# Patient Record
Sex: Female | Born: 1975 | Race: Black or African American | Hispanic: No | Marital: Married | Smoking: Never smoker
Health system: Southern US, Community
[De-identification: ages and names within clinical notes are randomized; demographics above are authoritative.]

## PROBLEM LIST (undated history)

## (undated) DIAGNOSIS — T7840XA Allergy, unspecified, initial encounter: Secondary | ICD-10-CM

## (undated) DIAGNOSIS — E559 Vitamin D deficiency, unspecified: Secondary | ICD-10-CM

## (undated) DIAGNOSIS — R7309 Other abnormal glucose: Secondary | ICD-10-CM

## (undated) HISTORY — DX: Other abnormal glucose: R73.09

## (undated) HISTORY — DX: Vitamin D deficiency, unspecified: E55.9

## (undated) HISTORY — DX: Allergy, unspecified, initial encounter: T78.40XA

---

## 1998-08-19 ENCOUNTER — Ambulatory Visit (HOSPITAL_COMMUNITY): Admission: RE | Admit: 1998-08-19 | Discharge: 1998-08-19 | Payer: Self-pay | Admitting: Infectious Diseases

## 1998-08-19 ENCOUNTER — Encounter: Payer: Self-pay | Admitting: Infectious Diseases

## 1999-11-08 ENCOUNTER — Other Ambulatory Visit: Admission: RE | Admit: 1999-11-08 | Discharge: 1999-11-08 | Payer: Self-pay | Admitting: Family Medicine

## 2000-11-26 ENCOUNTER — Other Ambulatory Visit: Admission: RE | Admit: 2000-11-26 | Discharge: 2000-11-26 | Payer: Self-pay | Admitting: Family Medicine

## 2000-12-03 ENCOUNTER — Encounter: Admission: RE | Admit: 2000-12-03 | Discharge: 2000-12-03 | Payer: Self-pay | Admitting: Family Medicine

## 2000-12-03 ENCOUNTER — Encounter: Payer: Self-pay | Admitting: Family Medicine

## 2002-02-13 ENCOUNTER — Other Ambulatory Visit: Admission: RE | Admit: 2002-02-13 | Discharge: 2002-02-13 | Payer: Self-pay | Admitting: Family Medicine

## 2004-03-15 ENCOUNTER — Other Ambulatory Visit: Admission: RE | Admit: 2004-03-15 | Discharge: 2004-03-15 | Payer: Self-pay | Admitting: Family Medicine

## 2005-04-03 ENCOUNTER — Emergency Department (HOSPITAL_COMMUNITY): Admission: EM | Admit: 2005-04-03 | Discharge: 2005-04-03 | Payer: Self-pay | Admitting: Family Medicine

## 2005-10-15 ENCOUNTER — Other Ambulatory Visit: Admission: RE | Admit: 2005-10-15 | Discharge: 2005-10-15 | Payer: Self-pay | Admitting: Family Medicine

## 2007-07-10 ENCOUNTER — Other Ambulatory Visit: Admission: RE | Admit: 2007-07-10 | Discharge: 2007-07-10 | Payer: Self-pay | Admitting: Family Medicine

## 2007-11-26 ENCOUNTER — Ambulatory Visit (HOSPITAL_COMMUNITY): Admission: RE | Admit: 2007-11-26 | Discharge: 2007-11-26 | Payer: Self-pay | Admitting: *Deleted

## 2007-12-11 ENCOUNTER — Encounter: Admission: RE | Admit: 2007-12-11 | Discharge: 2007-12-11 | Payer: Self-pay | Admitting: *Deleted

## 2008-04-08 ENCOUNTER — Ambulatory Visit (HOSPITAL_COMMUNITY): Admission: RE | Admit: 2008-04-08 | Discharge: 2008-04-08 | Payer: Self-pay | Admitting: *Deleted

## 2008-06-03 ENCOUNTER — Encounter: Admission: RE | Admit: 2008-06-03 | Discharge: 2008-09-01 | Payer: Self-pay | Admitting: Surgery

## 2008-06-28 ENCOUNTER — Ambulatory Visit (HOSPITAL_COMMUNITY): Admission: RE | Admit: 2008-06-28 | Discharge: 2008-06-29 | Payer: Self-pay | Admitting: *Deleted

## 2008-06-28 HISTORY — PX: LAPAROSCOPIC GASTRIC BANDING: SHX1100

## 2008-08-03 ENCOUNTER — Other Ambulatory Visit: Admission: RE | Admit: 2008-08-03 | Discharge: 2008-08-03 | Payer: Self-pay | Admitting: Family Medicine

## 2008-08-10 ENCOUNTER — Encounter: Admission: RE | Admit: 2008-08-10 | Discharge: 2008-09-23 | Payer: Self-pay | Admitting: Surgery

## 2008-09-21 ENCOUNTER — Encounter: Admission: RE | Admit: 2008-09-21 | Discharge: 2008-09-21 | Payer: Self-pay | Admitting: Surgery

## 2008-12-22 ENCOUNTER — Encounter: Admission: RE | Admit: 2008-12-22 | Discharge: 2009-01-19 | Payer: Self-pay | Admitting: Surgery

## 2009-01-22 HISTORY — PX: EYE SURGERY: SHX253

## 2009-08-30 ENCOUNTER — Other Ambulatory Visit: Admission: RE | Admit: 2009-08-30 | Discharge: 2009-08-30 | Payer: Self-pay | Admitting: Family Medicine

## 2010-02-15 IMAGING — CR DG ABDOMEN 1V
1 series · 1 of 1 positions shown · non-contrast
Comparison: None

CLINICAL DATA: Follow-up gastric banding

ABDOMEN - 1 VIEW

[t abdomen supine *]
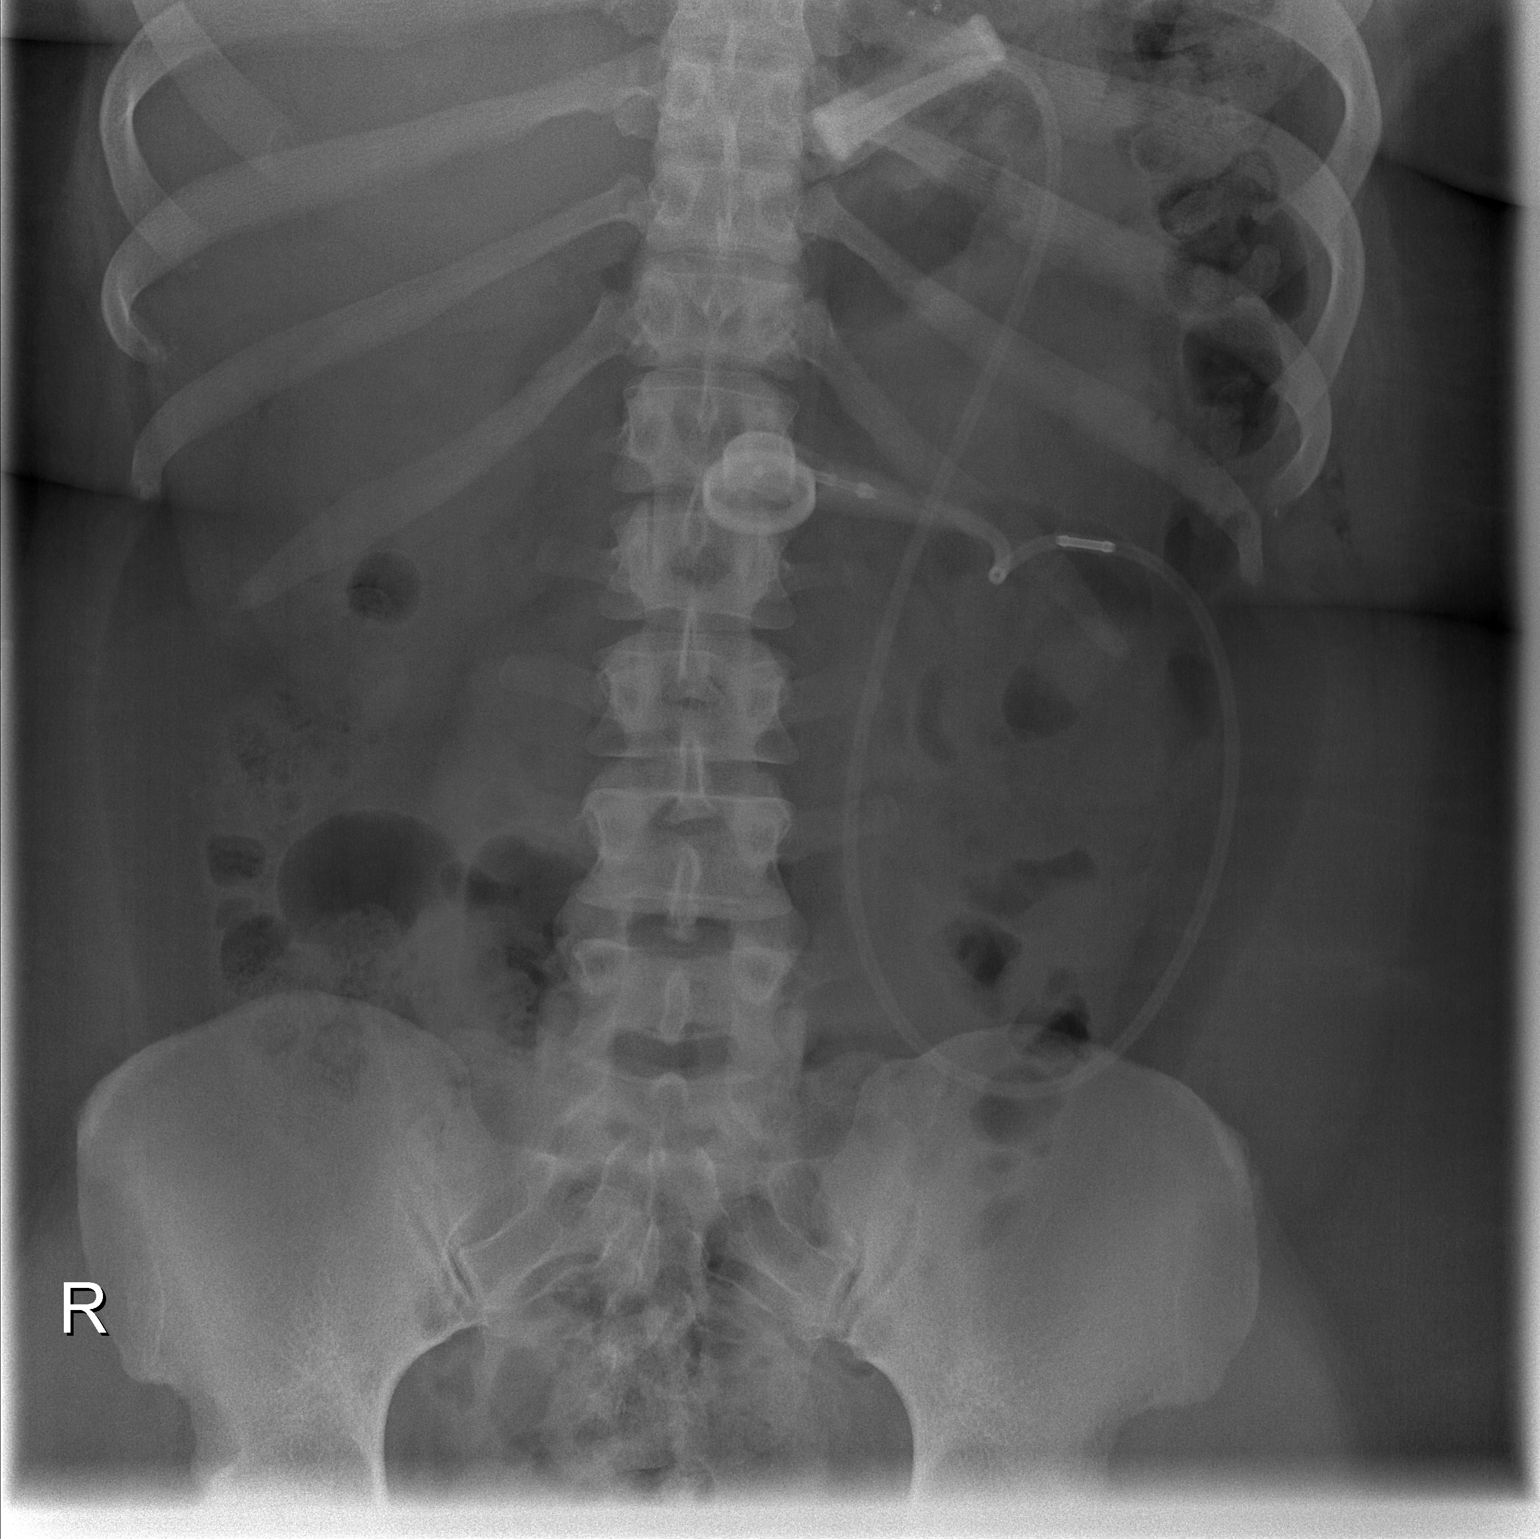

[1 of 1 positions shown; findings below may reference images not displayed]

FINDINGS: The gastric band is normal.  Normal bowel gas.  Osseous structures
normal.
IMPRESSION: Normal postop film.

## 2010-05-01 LAB — CBC
HCT: 39 % (ref 36.0–46.0)
HCT: 40 % (ref 36.0–46.0)
Hemoglobin: 13.2 g/dL (ref 12.0–15.0)
Hemoglobin: 13.3 g/dL (ref 12.0–15.0)
MCHC: 33.1 g/dL (ref 30.0–36.0)
MCHC: 33.9 g/dL (ref 30.0–36.0)
MCV: 90.4 fL (ref 78.0–100.0)
MCV: 91.1 fL (ref 78.0–100.0)
Platelets: 332 10*3/uL (ref 150–400)
Platelets: 371 10*3/uL (ref 150–400)
RBC: 4.28 MIL/uL (ref 3.87–5.11)
RBC: 4.43 MIL/uL (ref 3.87–5.11)
RDW: 12.6 % (ref 11.5–15.5)
RDW: 12.9 % (ref 11.5–15.5)
WBC: 4.3 10*3/uL (ref 4.0–10.5)
WBC: 8.9 10*3/uL (ref 4.0–10.5)

## 2010-05-01 LAB — DIFFERENTIAL
Basophils Absolute: 0 10*3/uL (ref 0.0–0.1)
Basophils Absolute: 0 10*3/uL (ref 0.0–0.1)
Basophils Relative: 0 % (ref 0–1)
Basophils Relative: 1 % (ref 0–1)
Eosinophils Absolute: 0 10*3/uL (ref 0.0–0.7)
Eosinophils Absolute: 0.1 10*3/uL (ref 0.0–0.7)
Eosinophils Relative: 0 % (ref 0–5)
Eosinophils Relative: 2 % (ref 0–5)
Lymphocytes Relative: 20 % (ref 12–46)
Lymphocytes Relative: 56 % — ABNORMAL HIGH (ref 12–46)
Lymphs Abs: 1.8 10*3/uL (ref 0.7–4.0)
Lymphs Abs: 2.4 10*3/uL (ref 0.7–4.0)
Monocytes Absolute: 0.2 10*3/uL (ref 0.1–1.0)
Monocytes Absolute: 0.5 10*3/uL (ref 0.1–1.0)
Monocytes Relative: 5 % (ref 3–12)
Monocytes Relative: 6 % (ref 3–12)
Neutro Abs: 1.6 10*3/uL — ABNORMAL LOW (ref 1.7–7.7)
Neutro Abs: 6.6 10*3/uL (ref 1.7–7.7)
Neutrophils Relative %: 37 % — ABNORMAL LOW (ref 43–77)
Neutrophils Relative %: 74 % (ref 43–77)

## 2010-05-01 LAB — COMPREHENSIVE METABOLIC PANEL
ALT: 21 U/L (ref 0–35)
AST: 23 U/L (ref 0–37)
Albumin: 4.2 g/dL (ref 3.5–5.2)
Alkaline Phosphatase: 55 U/L (ref 39–117)
BUN: 10 mg/dL (ref 6–23)
CO2: 30 mEq/L (ref 19–32)
Calcium: 9.5 mg/dL (ref 8.4–10.5)
Chloride: 107 mEq/L (ref 96–112)
Creatinine, Ser: 0.74 mg/dL (ref 0.4–1.2)
GFR calc Af Amer: >60 mL/min (ref >60)
GFR calc non Af Amer: >60 mL/min (ref >60)
Glucose, Bld: 92 mg/dL (ref 70–99)
Potassium: 5 mEq/L (ref 3.5–5.1)
Sodium: 143 mEq/L (ref 135–145)
Total Bilirubin: 0.6 mg/dL (ref 0.3–1.2)
Total Protein: 7.2 g/dL (ref 6.0–8.3)

## 2010-05-01 LAB — PREGNANCY, URINE: Preg Test, Ur: NEGATIVE

## 2010-06-06 NOTE — Op Note (Signed)
NAMEDANNILYNN, GALLINA                ACCOUNT NO.:  [id number]   MEDICAL RECORD NO.:  [id number]          PATIENT TYPE:  OIB   LOCATION:  1536                         FACILITY:  [facility]   PHYSICIAN:  Thornton Park. Daphine Deutscher, MD  DATE OF BIRTH:  30-Sep-1975   DATE OF PROCEDURE:  06/28/2008  DATE OF DISCHARGE:                               OPERATIVE REPORT   PREOPERATIVE DIAGNOSIS:  Morbid obesity, body mass index of 48.   POSTOPERATIVE DIAGNOSIS:  Morbid obesity, body mass index of 48.   PROCEDURE:  Laparoscopic gastric banding (Allergan APL system).   SURGEON:  Thornton Park. Daphine Deutscher, MD.   ASSISTANTMarland Kitchen  Sandria Bales. Ezzard Standing, M.D.   DESCRIPTION OF PROCEDURE:  Ms. Nevares is a 35 year old African American  lady who works on [employer] at [employer] and has been through our program for  weight loss.  She decided to proceed along the lap band pathway.   She was taken back to room one at [facility] and given general  anesthesia.  The abdomen was prepped with Techni-Care equivalent and  draped sterilely.  Access to the abdomen was achieved through the left  upper quadrant using an OptiView technique and a zero degree camera.  This was a 10-11 mm port.  The abdomen was insufflated and standard port  placements were used including a 15 in the right upper quadrant, 11  below that, another 11 left of the umbilicus for the camera, and a 5 in  the upper midline to retract the liver.   Interesting findings were that she had two prominent vessels going into  the left lateral segment which could be accessory hepatic arteries which  were certainly preserved.  She had a negative upper GI series.  There  was no evidence of a hiatal hernia.  I first dissected over on the left  side by the left crus for an exit site for the band passer.  We then  went down on the right crus and found a site to introduce the band  passer.  The band passer was introduced and went around and came out  without difficulty.  Initially, our  detail on our the APL band had sort  of torn, so I went ahead and replaced that, along with a little silk on  it to help hold it in place in the band passer.  This was threaded  through, came around, engaged in the buckle which was then snapped.  This was an APL band and it had seemed to be the right fit.  It was then  plicated with three sutures, taking the stomach and plicated it to  stomach creating a nice anterior plication.  These were held in place  with tie knots.  The band tubing was then brought out  through the lower port on the right and connected to a port which had  Marlex mesh on the back which was then placed in the subcutaneous  pocket.  It was closed with 4-0 Vicryl interrupted, Benzoin and Steri-  Strips.  The patient tolerated the procedure well and was taken to the  recovery  room in satisfactory condition.      Thornton Park Daphine Deutscher, MD  Electronically Signed     MBM/MEDQ  D:  06/28/2008  T:  06/28/2008  Job:  [id number]   cc:   Gretta Arab. Valentina Lucks, M.D.  Fax: [phone]   Maxie Better, M.D.  Fax: [phone]

## 2010-07-21 ENCOUNTER — Encounter (INDEPENDENT_AMBULATORY_CARE_PROVIDER_SITE_OTHER): Payer: Self-pay | Admitting: Physician Assistant

## 2010-07-21 ENCOUNTER — Ambulatory Visit (INDEPENDENT_AMBULATORY_CARE_PROVIDER_SITE_OTHER): Payer: Commercial Managed Care - PPO | Admitting: Physician Assistant

## 2010-07-21 DIAGNOSIS — Z4651 Encounter for fitting and adjustment of gastric lap band: Secondary | ICD-10-CM

## 2010-07-21 NOTE — Progress Notes (Signed)
Subjective:     Patient ID: Jessica Yoder, female   DOB: [birth year], 35 y.o.   MRN: [id number]    BP 122/76  Ht 5' 6.5" (1.689 m)  Wt 269 lb (122.018 kg)  BMI 42.77 kg/m2    HPI See scanned dictated note  Review of Systems     Objective:   Physical Exam     Assessment:         Plan:

## 2010-07-21 NOTE — Patient Instructions (Signed)
Take clear liquids for the next 48 hours. Thin protein shakes are ok to start on Saturday evening. Call us if you have persistent vomiting or regurgitation, night cough or reflux symptoms. Return as scheduled or sooner if you notice no changes in hunger/portion sizes.   

## 2010-10-06 ENCOUNTER — Encounter (INDEPENDENT_AMBULATORY_CARE_PROVIDER_SITE_OTHER): Payer: Self-pay | Admitting: Surgery

## 2011-05-25 ENCOUNTER — Emergency Department (HOSPITAL_COMMUNITY)
Admission: EM | Admit: 2011-05-25 | Discharge: 2011-05-25 | Disposition: A | Discharge status: Home / Self Care | Payer: 59 | Source: Home / Self Care | Attending: Family Medicine | Admitting: Family Medicine

## 2011-05-25 ENCOUNTER — Encounter (HOSPITAL_COMMUNITY): Payer: Self-pay | Admitting: *Deleted

## 2011-05-25 DIAGNOSIS — J209 Acute bronchitis, unspecified: Secondary | ICD-10-CM

## 2011-05-25 DIAGNOSIS — J302 Other seasonal allergic rhinitis: Secondary | ICD-10-CM

## 2011-05-25 DIAGNOSIS — J309 Allergic rhinitis, unspecified: Secondary | ICD-10-CM

## 2011-05-25 MED ORDER — AZITHROMYCIN 250 MG PO TABS: ORAL_TABLET | ORAL | Status: AC

## 2011-05-25 MED ORDER — HYDROCOD POLST-CHLORPHEN POLST 10-8 MG/5ML PO LQCR: 5.0000 mL | Freq: Two times a day (BID) | ORAL | Status: DC

## 2011-05-25 MED ORDER — CETIRIZINE HCL 10 MG PO TABS: 10.0000 mg | ORAL_TABLET | Freq: Every day | ORAL | Status: DC

## 2011-05-25 NOTE — ED Provider Notes (Signed)
History     CSN: [id number]  Arrival date & time 05/25/11  0814   First MD Initiated Contact with Patient 05/25/11 0831      Chief Complaint  Patient presents with  . Cough  . Sore Throat  . Fever    (Consider location/radiation/quality/duration/timing/severity/associated sxs/prior treatment) Patient is a 36 y.o. female presenting with cough, pharyngitis, and fever. The history is provided by the patient.  Cough This is a new problem. The current episode started more than 2 days ago. The problem has been gradually worsening. The cough is productive of sputum. Associated symptoms include sore throat. Pertinent negatives include no shortness of breath and no wheezing. She is not a smoker.  Sore Throat Pertinent negatives include no shortness of breath.  Fever Primary symptoms of the febrile illness include fever and cough. Primary symptoms do not include wheezing or shortness of breath.    Past Medical History  Diagnosis Date  . Allergy   . Migraine     Past Surgical History  Procedure Date  . Laparoscopic gastric banding 2010  . Eye surgery 2011    Lasik    Family History  Problem Relation Age of Onset  . Other Father     ETOH Abuse    History  Substance Use Topics  . Smoking status: Never Smoker   . Smokeless tobacco: Not on file  . Alcohol Use: Yes     socially    OB History    Grav Para Term Preterm Abortions TAB SAB Ect Mult Living                  Review of Systems  Constitutional: Positive for fever.  HENT: Positive for congestion and sore throat.   Respiratory: Positive for cough. Negative for shortness of breath and wheezing.   Gastrointestinal: Negative.     Allergies  Amitriptyline and Latex  Home Medications   Current Outpatient Rx  Name Route Sig Dispense Refill  . GUAIFENESIN ER 600 MG PO TB12 Oral Take 1,200 mg by mouth as needed.    . IBUPROFEN 200 MG PO TABS Oral Take 200 mg by mouth every 6 (six) hours as needed.    .  AZITHROMYCIN 250 MG PO TABS  Take as directed on pack 6 each 0  . CALCIUM-VITAMIN D 500-200 MG-UNIT PO TABS Oral Take 1 tablet by mouth 2 (two) times daily with a meal. Patient takes 600 mg     . CETIRIZINE HCL 10 MG PO TABS Oral Take 1 tablet (10 mg total) by mouth daily. One tab daily for allergies 30 tablet 1  . HYDROCOD POLST-CPM POLST ER 10-8 MG/5ML PO LQCR Oral Take 5 mLs by mouth every 12 (twelve) hours. 115 mL 0  . ONE-DAILY MULTI VITAMINS PO TABS Oral Take 1 tablet by mouth daily.        BP 105/67  Pulse 88  Temp(Src) 98.9 F (37.2 C) (Oral)  Resp 16  SpO2 99%  LMP 05/25/2011  Physical Exam  Nursing note and vitals reviewed. Constitutional: She is oriented to person, place, and time. She appears well-developed and well-nourished.  HENT:  Head: Normocephalic.  Right Ear: External ear normal.  Left Ear: External ear normal.  Mouth/Throat: Oropharynx is clear and moist.  Eyes: Conjunctivae are normal. Pupils are equal, round, and reactive to light.  Neck: Normal range of motion. Neck supple.  Cardiovascular: Normal rate, regular rhythm, normal heart sounds and intact distal pulses.   Pulmonary/Chest: Effort normal and breath  sounds normal.  Lymphadenopathy:    She has no cervical adenopathy.  Neurological: She is alert and oriented to person, place, and time.  Skin: Skin is warm and dry.    ED Course  Procedures (including critical care time)  Labs Reviewed - No data to display No results found.   1. Bronchitis, acute   2. Seasonal allergies       MDM          Linna Hoff, MD 05/25/11 [id number]

## 2011-05-25 NOTE — Discharge Instructions (Signed)
Take all of medicine, drink lots of fluids,   return or see your doctor if further problems  °

## 2011-05-25 NOTE — ED Notes (Signed)
Pt is here with complaints of cough with green sputum, sore throat and fever since Tuesday.  Pt has lap band and is requesting liquid meds if a Rx is written.

## 2011-08-16 ENCOUNTER — Telehealth (INDEPENDENT_AMBULATORY_CARE_PROVIDER_SITE_OTHER): Payer: Self-pay | Admitting: General Surgery

## 2011-08-16 NOTE — Telephone Encounter (Signed)
Pt calling for possible lap band adjustment.  She is experiencing reflux (burning) and hiccups.  Tried to slow her eating, but still has reflux.  Denies vomiting.  No room on [facility] for another 4 weeks, so will try to work in with her surgeon, Dr. Daphine Deutscher.  Pt works at [employer] and can break free to walk over at any time.  Please advise; can leave VM on her cell phone.

## 2011-08-16 NOTE — Telephone Encounter (Signed)
Scheduled patient to see Dr Daphine Deutscher on 08/22/11 @1 :30 per Dr Daphine Deutscher

## 2011-08-22 ENCOUNTER — Encounter (INDEPENDENT_AMBULATORY_CARE_PROVIDER_SITE_OTHER): Payer: Self-pay | Admitting: Surgery

## 2011-08-22 ENCOUNTER — Ambulatory Visit (INDEPENDENT_AMBULATORY_CARE_PROVIDER_SITE_OTHER): Payer: Commercial Managed Care - PPO | Admitting: Surgery

## 2011-08-22 VITALS — BP 128/76 | HR 68 | Temp 98.1°F | Resp 16 | Ht 66.5 in | Wt 273.2 lb

## 2011-08-22 DIAGNOSIS — Z9884 Bariatric surgery status: Secondary | ICD-10-CM

## 2011-08-22 NOTE — Progress Notes (Signed)
Jessica Yoder Body mass index is 43.44 kg/(m^2).  Having regurgitation:  Yes-5 times recently  Nocturnal reflux?  some  Amount of fill   - 5 cc  She has done well until last 2 weeks and has developed vomiting.  May be anterior slip. Band vacation prescribed for 2 weeks then would gradually refill.

## 2011-08-22 NOTE — Patient Instructions (Signed)
I removed 5 cc from your Lapband APL.  This is a band vacation  1. Stay on liquids for the next 2 days as you adapt to your new fill volume.  Then resume your previous diet. 2. Decreasing your carbohydrate intake will hasten you weight loss.  Rely more on proteins for your meals.  Avoid condiments that contain sweets such as Honey Mustard and sugary salad dressings.   3. Stay in the "green zone".  If you are regurgitating with meals, having night time reflux, and find yourself eating soft comfort foods (mashed potatoes, potato chips)...realize that you are developing "maladaptive eating".  You will not lose weight this way and may regain weight.  The GREEN ZONE is eating smaller portions and not regurgitating.  Hence we may need to withdraw fluid from your band. 4. Build exercise into your daily routine.  Walking is the best way to start but do something every day if you can.

## 2011-09-06 ENCOUNTER — Ambulatory Visit (INDEPENDENT_AMBULATORY_CARE_PROVIDER_SITE_OTHER): Payer: Commercial Managed Care - PPO | Admitting: Physician Assistant

## 2011-09-06 ENCOUNTER — Encounter (INDEPENDENT_AMBULATORY_CARE_PROVIDER_SITE_OTHER): Payer: Commercial Managed Care - PPO

## 2011-09-06 ENCOUNTER — Encounter (INDEPENDENT_AMBULATORY_CARE_PROVIDER_SITE_OTHER): Payer: Self-pay

## 2011-09-06 VITALS — BP 120/70 | HR 66 | Resp 12 | Ht 66.5 in | Wt 274.0 lb

## 2011-09-06 DIAGNOSIS — Z4651 Encounter for fitting and adjustment of gastric lap band: Secondary | ICD-10-CM

## 2011-09-06 NOTE — Progress Notes (Signed)
  HISTORY: Jessica Yoder is a 36 y.o.female who received an AP-Large lap-band in June 2010 by Dr. Daphine Deutscher. She comes in having had 5 mL fluid removed last month for obstructive symptoms. She no longer has difficulty swallowing and would like a fill. She's hungry and eating more.  VITAL SIGNS: Filed Vitals:   09/06/11 1155  BP: 120/70  Pulse: 66  Resp: 12    PHYSICAL EXAM: Physical exam reveals a very well-appearing 36 y.o.female in no apparent distress Neurologic: Awake, alert, oriented Psych: Bright affect, conversant Respiratory: Breathing even and unlabored. No stridor or wheezing Abdomen: Soft, nontender, nondistended to palpation. Incisions well-healed. No incisional hernias. Port easily palpated. Extremities: Atraumatic, good range of motion.  ASSESMENT: 36 y.o.  female  s/p AP-Large lap-band.   PLAN: The patient's port was accessed with a 20G Huber needle without difficulty. Clear fluid was aspirated and 3 mL saline was added to the port. The patient was able to swallow water without difficulty following the procedure and was instructed to take clear liquids for the next 24-48 hours and advance slowly as tolerated.

## 2011-09-06 NOTE — Patient Instructions (Signed)
Take clear liquids tonight. Thin protein shakes are ok to start tomorrow morning. Slowly advance your diet thereafter. Call us if you have persistent vomiting or regurgitation, night cough or reflux symptoms. Return as scheduled or sooner if you notice no changes in hunger/portion sizes.  

## 2011-09-20 ENCOUNTER — Encounter (INDEPENDENT_AMBULATORY_CARE_PROVIDER_SITE_OTHER): Payer: Commercial Managed Care - PPO

## 2011-09-27 ENCOUNTER — Ambulatory Visit (INDEPENDENT_AMBULATORY_CARE_PROVIDER_SITE_OTHER): Payer: Commercial Managed Care - PPO | Admitting: Physician Assistant

## 2011-09-27 ENCOUNTER — Encounter (INDEPENDENT_AMBULATORY_CARE_PROVIDER_SITE_OTHER): Payer: Self-pay

## 2011-09-27 VITALS — BP 112/70 | Ht 66.5 in | Wt 278.0 lb

## 2011-09-27 DIAGNOSIS — Z4651 Encounter for fitting and adjustment of gastric lap band: Secondary | ICD-10-CM

## 2011-09-27 NOTE — Progress Notes (Signed)
  HISTORY: Jessica Yoder is a 36 y.o.female who received an AP-Large lap-band in June 2010 by Dr. Daphine Deutscher. She comes in with persistent hunger and larger than desired portions since her last visit a month ago. She had fluid removed in July secondary to GERD symptoms, which are no longer an issue.  VITAL SIGNS: Filed Vitals:   09/27/11 1137  BP: 112/70    PHYSICAL EXAM: Physical exam reveals a very well-appearing 36 y.o.female in no apparent distress Neurologic: Awake, alert, oriented Psych: Bright affect, conversant Respiratory: Breathing even and unlabored. No stridor or wheezing Abdomen: Soft, nontender, nondistended to palpation. Incisions well-healed. No incisional hernias. Port easily palpated. Extremities: Atraumatic, good range of motion.  ASSESMENT: 36 y.o.  female  s/p AP-Large lap-band.   PLAN: The patient's port was accessed with a 20G Huber needle without difficulty. Clear fluid was aspirated and 1 mL saline was added to the port. The patient was able to swallow water without difficulty following the procedure and was instructed to take clear liquids for the next 24-48 hours and advance slowly as tolerated.

## 2011-09-27 NOTE — Patient Instructions (Signed)
Take clear liquids tonight. Thin protein shakes are ok to start tomorrow morning. Slowly advance your diet thereafter. Call us if you have persistent vomiting or regurgitation, night cough or reflux symptoms. Return as scheduled or sooner if you notice no changes in hunger/portion sizes.  

## 2011-12-04 ENCOUNTER — Encounter (HOSPITAL_COMMUNITY): Payer: Self-pay | Admitting: Emergency Medicine

## 2011-12-04 ENCOUNTER — Emergency Department (HOSPITAL_COMMUNITY)
Admission: EM | Admit: 2011-12-04 | Discharge: 2011-12-04 | Disposition: A | Discharge status: Home / Self Care | Payer: 59 | Source: Home / Self Care | Attending: Family Medicine | Admitting: Family Medicine

## 2011-12-04 DIAGNOSIS — J019 Acute sinusitis, unspecified: Secondary | ICD-10-CM

## 2011-12-04 LAB — POCT RAPID STREP A: Streptococcus, Group A Screen (Direct): NEGATIVE

## 2011-12-04 MED ORDER — IPRATROPIUM BROMIDE 0.06 % NA SOLN: 2.0000 | Freq: Four times a day (QID) | NASAL | Status: DC

## 2011-12-04 MED ORDER — AZITHROMYCIN 250 MG PO TABS: ORAL_TABLET | ORAL | Status: DC

## 2011-12-04 NOTE — ED Notes (Signed)
Reports sore throat for four weeks now.  Patient states it started as a cold.  Reports using OTC meds but no relief.

## 2011-12-04 NOTE — ED Provider Notes (Signed)
History     CSN: [id number]  Arrival date & time 12/04/11  1750   First MD Initiated Contact with Patient 12/04/11 1755      Chief Complaint  Patient presents with  . Sore Throat    (Consider location/radiation/quality/duration/timing/severity/associated sxs/prior treatment) Patient is a 36 y.o. female presenting with pharyngitis. The history is provided by the patient.  Sore Throat This is a new problem. The current episode started more than 1 week ago (4wk h/o sx.). The problem has been gradually worsening. Pertinent negatives include no chest pain and no abdominal pain. The symptoms are aggravated by swallowing. Treatments tried: mult otc meds., last week had colored drainage. The treatment provided no relief.    Past Medical History  Diagnosis Date  . Allergy   . Migraine     Past Surgical History  Procedure Date  . Laparoscopic gastric banding 06/28/2008  . Eye surgery 2011    Lasik    Family History  Problem Relation Age of Onset  . Other Father     ETOH Abuse    History  Substance Use Topics  . Smoking status: Never Smoker   . Smokeless tobacco: Not on file  . Alcohol Use: Yes     Comment: socially    OB History    Grav Para Term Preterm Abortions TAB SAB Ect Mult Living                  Review of Systems  Constitutional: Negative.   HENT: Positive for congestion, sore throat, rhinorrhea and postnasal drip.   Respiratory: Negative for cough.   Cardiovascular: Negative for chest pain.  Gastrointestinal: Negative for abdominal pain.    Allergies  Amitriptyline and Latex  Home Medications   Current Outpatient Rx  Name  Route  Sig  Dispense  Refill  . AZITHROMYCIN 250 MG PO TABS      Take as directed on pack   6 each   0   . CALCIUM-VITAMIN D 500-200 MG-UNIT PO TABS   Oral   Take 1 tablet by mouth 2 (two) times daily with a meal. Patient takes 600 mg          . CETIRIZINE HCL 10 MG PO TABS   Oral   Take 1 tablet (10 mg total) by  mouth daily. One tab daily for allergies   30 tablet   1   . IPRATROPIUM BROMIDE 0.06 % NA SOLN   Nasal   Place 2 sprays into the nose 4 (four) times daily.   15 mL   1   . ONE-DAILY MULTI VITAMINS PO TABS   Oral   Take 1 tablet by mouth daily.             BP 131/95  Pulse 93  Temp 98.7 F (37.1 C) (Oral)  Resp 16  SpO2 100%  Physical Exam  Nursing note and vitals reviewed. Constitutional: She is oriented to person, place, and time. She appears well-developed and well-nourished.  HENT:  Head: Normocephalic.  Right Ear: External ear normal.  Left Ear: External ear normal.  Nose: Nose normal.  Mouth/Throat: Oropharynx is clear and moist.  Eyes: Conjunctivae normal are normal. Pupils are equal, round, and reactive to light.  Neck: Normal range of motion. Neck supple.  Cardiovascular: Normal rate, regular rhythm, normal heart sounds and intact distal pulses.   Pulmonary/Chest: Effort normal and breath sounds normal.  Abdominal: Soft. Bowel sounds are normal.  Lymphadenopathy:    She has no  cervical adenopathy.  Neurological: She is alert and oriented to person, place, and time.  Skin: Skin is warm and dry.    ED Course  Procedures (including critical care time)   Labs Reviewed  POCT RAPID STREP A ([facility]RG CARE ONLY)  LAB REPORT - SCANNED   No results found.   1. Sinusitis, acute       MDM  Strep neg.       Linna Hoff, MD 12/06/11 2037

## 2011-12-27 ENCOUNTER — Encounter (INDEPENDENT_AMBULATORY_CARE_PROVIDER_SITE_OTHER): Payer: Commercial Managed Care - PPO

## 2012-01-24 ENCOUNTER — Encounter (INDEPENDENT_AMBULATORY_CARE_PROVIDER_SITE_OTHER): Payer: Self-pay | Admitting: Physician Assistant

## 2012-10-23 ENCOUNTER — Ambulatory Visit (INDEPENDENT_AMBULATORY_CARE_PROVIDER_SITE_OTHER): Payer: Commercial Managed Care - PPO | Admitting: Physician Assistant

## 2012-10-23 ENCOUNTER — Encounter (INDEPENDENT_AMBULATORY_CARE_PROVIDER_SITE_OTHER): Payer: Self-pay

## 2012-10-23 DIAGNOSIS — Z4651 Encounter for fitting and adjustment of gastric lap band: Secondary | ICD-10-CM

## 2012-10-23 NOTE — Patient Instructions (Signed)

## 2012-10-23 NOTE — Progress Notes (Signed)
  HISTORY: Jessica Yoder is a 37 y.o.female who received an AP-Large lap-band in June 2010 by Dr. Daphine Deutscher. She comes in having been last seen 13 months ago and has unfortunately gained weight to the point of being 3 lbs over her pre-op weight. She is eager to get back on track with weight loss. She is able to eat pretty much anything she wants. In a positive light, she is exercising regularly. She would like a fill today and a referral to nutrition.  VITAL SIGNS: Filed Vitals:   10/23/12 0857  BP: 118/72  Pulse: 68  Resp: 14    PHYSICAL EXAM: Physical exam reveals a very well-appearing 37 y.o.female in no apparent distress Neurologic: Awake, alert, oriented Psych: Bright affect, conversant Respiratory: Breathing even and unlabored. No stridor or wheezing Abdomen: Soft, nontender, nondistended to palpation. Incisions well-healed. No incisional hernias. Port easily palpated. Extremities: Atraumatic, good range of motion.  ASSESMENT: 37 y.o.  female  s/p AP-large lap-band.   PLAN: The patient's port was accessed with a 20G Huber needle without difficulty. Clear fluid was aspirated and 0.5 mL saline was added to the port. The patient was able to swallow water without difficulty following the procedure and was instructed to take clear liquids for the next 24-48 hours and advance slowly as tolerated. I attempted to add a full mL but water was going down far too slowly with this fill volume. I've set her up to see Huntley Dec in Nutrition and to return to see [name] in one month.

## 2012-11-05 ENCOUNTER — Ambulatory Visit: Payer: 59 | Admitting: *Deleted

## 2012-11-06 ENCOUNTER — Other Ambulatory Visit (HOSPITAL_COMMUNITY)
Admission: RE | Admit: 2012-11-06 | Discharge: 2012-11-06 | Disposition: A | Discharge status: Home / Self Care | Payer: 59 | Source: Ambulatory Visit | Attending: Family Medicine | Admitting: Family Medicine

## 2012-11-06 ENCOUNTER — Other Ambulatory Visit: Payer: Self-pay | Admitting: Physician Assistant

## 2012-11-06 DIAGNOSIS — Z124 Encounter for screening for malignant neoplasm of cervix: Secondary | ICD-10-CM | POA: Insufficient documentation

## 2012-11-20 ENCOUNTER — Encounter: Payer: 59 | Attending: Surgery | Admitting: Dietician

## 2012-11-20 ENCOUNTER — Encounter (INDEPENDENT_AMBULATORY_CARE_PROVIDER_SITE_OTHER): Payer: Self-pay

## 2012-11-20 ENCOUNTER — Ambulatory Visit (INDEPENDENT_AMBULATORY_CARE_PROVIDER_SITE_OTHER): Payer: Commercial Managed Care - PPO | Admitting: Physician Assistant

## 2012-11-20 ENCOUNTER — Encounter: Payer: Self-pay | Admitting: Dietician

## 2012-11-20 VITALS — BP 136/70 | HR 68 | Resp 16 | Ht 66.5 in | Wt 297.4 lb

## 2012-11-20 DIAGNOSIS — Z9884 Bariatric surgery status: Secondary | ICD-10-CM

## 2012-11-20 DIAGNOSIS — Z713 Dietary counseling and surveillance: Secondary | ICD-10-CM | POA: Insufficient documentation

## 2012-11-20 NOTE — Progress Notes (Signed)
  Lap Band Refresher  Medical Nutrition Therapy:  Appt start time: 1015 end time:  .  Primary concerns today: Post-operative Bariatric Surgery Nutrition Management. Jessica Yoder returns today for a Lap Band Refresher she had surgery in 2010 and had lost 50 pounds, her lowest weight was in the low 250s. Starting gaining weight back in the past year. Had the band mostly emptied last year after having severe nausea and vomiting. Had a fill on 10/23/12 (0.5 mL). Lost about 6-7 lbs since that appointment and feels like she in the green zone most of the time.   Lives with her husband and planning to adopt a baby in 3 weeks so ready to make some healthy life changes.   Preferred Learning Style:   Visual  Learning Readiness:  Ready  24-hr recall: B (AM): kind bar or boiled egg and skips half of the week Snk (AM): not routinely   L (PM): rotisserie chicken, chicken tenders, salad or soup Snk (PM): none  D (PM): salad with grilled chicken or salmon or chick fil a  Snk  (PM): none  Fluid intake: water and 24 oz sweet tea, 2 cups of coffee with creamer and 4 packs of sugar per cup Estimated total protein intake: 40-50 g  Medications: see Supplementation: taking  Using straws: No Drinking while eating: stopped since early October Hair loss: No Carbonated beverages: No N/V/D/C: Vomiting when not chewing (about 1 x in past month) Last Lap-Band fill: 10/23/2012, 0.5 mL Recent physical activity:  Gym 3-4 week Zumba and swimming\  Progress Towards Goal(s):  In progress.  Handouts given during visit include:  Bariatric Surgery Specialized Post-Op Diet  15 g CHO Snacks   Nutritional Diagnosis:  [place]-3.3 Overweight/obesity related to past poor dietary habits and physical inactivity as evidenced by patient w/ history of  LAGB surgery following dietary guidelines for continued weight loss.    Intervention:  Nutrition education provided.  Teaching Method Utilized:  Visual Auditory  Barriers to  learning/adherence to lifestyle change: busy schedule, adopting new baby in 3 weeks  Demonstrated degree of understanding via:  Teach Back   Monitoring/Evaluation:  Dietary intake, exercise, lap band fills, and body weight. Follow up in 6 weeks.

## 2012-11-20 NOTE — Progress Notes (Signed)
  HISTORY: Jessica Yoder is a 37 y.o.female who received an AP-Large lap-band in June 2010 by Dr. Daphine Deutscher. She comes in with 6 lbs weight loss since her visit earlier this month. She has much improvement in her hunger and portion sizes. She is very pleased with her weight loss, which places her weight now below her pre-op weight. She does complain of frequent epigastric burning that she describes as heartburn. She has no complaints of solid food dysphagia. Tums helps her symptoms. She denies regurgitation or night cough. She also denies pain or redness at her port site.  VITAL SIGNS: Filed Vitals:   11/20/12 0900  BP: 136/70  Pulse: 68  Resp: 16    PHYSICAL EXAM: Physical exam reveals a very well-appearing 37 y.o.female in no apparent distress Neurologic: Awake, alert, oriented Psych: Bright affect, conversant Respiratory: Breathing even and unlabored. No stridor or wheezing Extremities: Atraumatic, good range of motion. Skin: Warm, Dry, no rashes Musculoskeletal: Normal gait, Joints normal Abdomen: Soft, nontender, nondistended. Port side nontender. No erythema.  ASSESMENT: 37 y.o.  female  s/p AP-Large lap-band.   PLAN: I've recommended nexium for two weeks for her symptoms. As she's in the green zone, a fill isn't indicated. We'll have her back in one month for a recheck. She's scheduled to visit with nutrition today.

## 2012-11-20 NOTE — Patient Instructions (Signed)
Goals:  Follow Bariatric Surgery Specialized Post-Op Diet (ideas)  Aim for maximum of 15 grams of carbs per meal/10-15 grams per snack  Avoid white starches and starchy veggies (potatoes, peas, corn, etc)  Avoid sweetened drinks  Eat 3-6 small meals/snacks, every 3-5 hrs  No meal skipping  Increase lean protein foods to meet 60-80g goal  Always have a protein source with carbs  Increase fluid intake to 64oz +  Aim for >30 min of physical activity daily    Continue daily vitamin supplementation   *(1) complete multivitamin with iron and 2-3 doses of 500 mg calcium citrate  * Make sure to separate the multivitamin and calcium by 2 hours to prevent iron/calcium from binding together and leaving your body  * Make sure to take your calcium in 3 separate doses because your body cannot absorb more than 500 mg at a time

## 2012-11-20 NOTE — Patient Instructions (Signed)
Return in one month. Focus on good food choices as well as physical activity. Return sooner if you have an increase in hunger, portion sizes or weight. Return also for difficulty swallowing, night cough, reflux. Take Nexium (20 mg), one tablet daily for at least two weeks. Take Tums as needed.

## 2012-12-25 ENCOUNTER — Encounter (INDEPENDENT_AMBULATORY_CARE_PROVIDER_SITE_OTHER): Payer: Commercial Managed Care - PPO

## 2013-01-01 ENCOUNTER — Ambulatory Visit: Payer: 59 | Admitting: Dietician

## 2013-01-05 ENCOUNTER — Encounter (INDEPENDENT_AMBULATORY_CARE_PROVIDER_SITE_OTHER): Payer: Self-pay | Admitting: Physician Assistant

## 2013-03-25 ENCOUNTER — Emergency Department (HOSPITAL_COMMUNITY)
Admission: EM | Admit: 2013-03-25 | Discharge: 2013-03-25 | Disposition: A | Discharge status: Home / Self Care | Payer: 59 | Source: Home / Self Care | Attending: Family Medicine | Admitting: Family Medicine

## 2013-03-25 DIAGNOSIS — J069 Acute upper respiratory infection, unspecified: Secondary | ICD-10-CM

## 2013-03-25 MED ORDER — MINOCYCLINE HCL 100 MG PO CAPS: 100.0000 mg | ORAL_CAPSULE | Freq: Two times a day (BID) | ORAL | Status: DC

## 2013-03-25 NOTE — ED Provider Notes (Signed)
CSN: [id number]     Arrival date & time 03/25/13  1042 History   First MD Initiated Contact with Patient 03/25/13 1144     No chief complaint on file.  (Consider location/radiation/quality/duration/timing/severity/associated sxs/prior Treatment) Patient is a 38 y.o. female presenting with URI. The history is provided by the patient.  URI Presenting symptoms: congestion, cough, rhinorrhea and sore throat   Presenting symptoms: no fever   Severity:  Mild Onset quality:  Gradual Duration:  2 weeks Progression:  Unchanged Chronicity:  New Ineffective treatments:  Hot fluids Risk factors: sick contacts   Risk factors comment:  Daughter similarly sick.   Past Medical History  Diagnosis Date  . Allergy   . Migraine    Past Surgical History  Procedure Laterality Date  . Laparoscopic gastric banding  06/28/2008  . Eye surgery  2011    Lasik   Family History  Problem Relation Age of Onset  . Other Father     ETOH Abuse  . Cancer Other   . Hyperlipidemia Other   . Stroke Other   . Diabetes Other   . Obesity Other   . Sleep apnea Other    History  Substance Use Topics  . Smoking status: Never Smoker   . Smokeless tobacco: Not on file  . Alcohol Use: Yes     Comment: socially   OB History   Grav Para Term Preterm Abortions TAB SAB Ect Mult Living                 Review of Systems  Constitutional: Negative.  Negative for fever.  HENT: Positive for congestion, postnasal drip, rhinorrhea and sore throat.   Respiratory: Positive for cough.   Cardiovascular: Negative.   Gastrointestinal: Negative.     Allergies  Amitriptyline and Latex  Home Medications   Current Outpatient Rx  Name  Route  Sig  Dispense  Refill  . Calcium Carbonate-Vitamin D (CALCIUM-VITAMIN D) 500-200 MG-UNIT per tablet   Oral   Take 1 tablet by mouth 2 (two) times daily with a meal. Patient takes 600 mg          . minocycline (MINOCIN,DYNACIN) 100 MG capsule   Oral   Take 1 capsule (100 mg  total) by mouth 2 (two) times daily.   20 capsule   0   . Multiple Vitamin (MULTIVITAMIN) tablet   Oral   Take 1 tablet by mouth daily.           . ranitidine (ZANTAC) 150 MG tablet   Oral   Take 150 mg by mouth 2 (two) times daily.          BP 104/69  Pulse 89  Temp(Src) 99.2 F (37.3 C) (Oral)  Resp 19  SpO2 98% Physical Exam  Nursing note and vitals reviewed. Constitutional: She is oriented to person, place, and time. She appears well-developed and well-nourished.  HENT:  Head: Normocephalic.  Right Ear: External ear normal.  Left Ear: External ear normal.  Nose: Nose normal.  Mouth/Throat: Oropharynx is clear and moist.  Eyes: Pupils are equal, round, and reactive to light.  Neck: Normal range of motion. Neck supple.  Cardiovascular: Regular rhythm and intact distal pulses.   Pulmonary/Chest: Breath sounds normal.  Neurological: She is alert and oriented to person, place, and time.  Skin: Skin is warm and dry.    ED Course  Procedures (including critical care time) Labs Review Labs Reviewed - No data to display Imaging Review No results found.  MDM   1. URI (upper respiratory infection)       Linna Hoff, MD 03/27/13 1232

## 2013-03-25 NOTE — ED Notes (Signed)
Patient had questions about her medications.

## 2013-03-25 NOTE — Discharge Instructions (Signed)
Drink plenty of fluids as discussed, use medicine as prescribed, and mucinex or delsym for cough. Return or see your doctor if further problems °

## 2013-03-29 ENCOUNTER — Ambulatory Visit: Payer: 59

## 2013-03-29 ENCOUNTER — Telehealth: Payer: Self-pay

## 2013-03-29 ENCOUNTER — Ambulatory Visit (INDEPENDENT_AMBULATORY_CARE_PROVIDER_SITE_OTHER): Payer: 59 | Admitting: Family Medicine

## 2013-03-29 VITALS — BP 120/76 | HR 124 | Temp 98.7°F | Resp 18 | Ht 66.5 in | Wt 293.8 lb

## 2013-03-29 DIAGNOSIS — R05 Cough: Secondary | ICD-10-CM

## 2013-03-29 DIAGNOSIS — R059 Cough, unspecified: Secondary | ICD-10-CM

## 2013-03-29 DIAGNOSIS — R062 Wheezing: Secondary | ICD-10-CM

## 2013-03-29 DIAGNOSIS — J209 Acute bronchitis, unspecified: Secondary | ICD-10-CM

## 2013-03-29 MED ORDER — ALBUTEROL SULFATE HFA 108 (90 BASE) MCG/ACT IN AERS: 2.0000 | INHALATION_SPRAY | Freq: Four times a day (QID) | RESPIRATORY_TRACT | Status: DC | PRN

## 2013-03-29 MED ORDER — HYDROCODONE-HOMATROPINE 5-1.5 MG/5ML PO SYRP: 5.0000 mL | ORAL_SOLUTION | Freq: Three times a day (TID) | ORAL | Status: DC | PRN

## 2013-03-29 MED ORDER — PREDNISONE 20 MG PO TABS: 40.0000 mg | ORAL_TABLET | Freq: Every day | ORAL | Status: DC

## 2013-03-29 NOTE — Patient Instructions (Signed)

## 2013-03-29 NOTE — Progress Notes (Signed)
Patient ID: Jessica Yoder MRN: [id number], DOB: Apr 23, 1975, 38 y.o. Date of Encounter: 03/29/2013, 8:13 AM  Primary Physician: Astrid DivineGRIFFIN,ELAINE COLLINS, MD  Chief Complaint:  Chief Complaint  Patient presents with  . Cough    x 2 days   . chest congestion  . chest tightness    HPI: 38 y.o. year old female presents with a 4 day history of nasal congestion, post nasal drip, sore throat, and cough. Mild sinus pressure. Afebrile. No chills. Nasal congestion thick and green/yellow. Cough is productive of green/yellow sputum and not associated with time of day. Ears feel full, leading to sensation of muffled hearing. Has tried OTC cold preps without success. No GI complaints.   No sick contacts,or recent travels. She  Was seen last Wednesday and was started on minocin, but the sinus congestion cleared while the cough has significantly worsened.  No h/o asthma  No leg trauma, sedentary periods, h/o cancer, or tobacco use.  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    Past Medical History  Diagnosis Date  . Allergy   . Migraine      Home Meds: Prior to Admission medications   Medication Sig Start Date End Date Taking? Authorizing Provider  Calcium Carbonate-Vitamin D (CALCIUM-VITAMIN D) 500-200 MG-UNIT per tablet Take 1 tablet by mouth 2 (two) times daily with a meal. Patient takes 600 mg    Yes Historical Provider, MD  minocycline (MINOCIN,DYNACIN) 100 MG capsule Take 1 capsule (100 mg total) by mouth 2 (two) times daily. 03/25/13  Yes Linna HoffJames D Kindl, MD  Multiple Vitamin (MULTIVITAMIN) tablet Take 1 tablet by mouth daily.     Yes Historical Provider, MD  ranitidine (ZANTAC) 150 MG tablet Take 150 mg by mouth 2 (two) times daily.    Historical Provider, MD    Allergies:  Allergies  Allergen Reactions  . Amitriptyline Rash  . Latex Rash    History   Social History  . Marital Status: Married    Spouse Name: N/A    Number of Children: N/A  . Years of Education: N/A   Occupational History  .  Not on file.   Social History Main Topics  . Smoking status: Never Smoker   . Smokeless tobacco: Not on file  . Alcohol Use: Yes     Comment: socially  . Drug Use: No  . Sexual Activity: Yes    Birth Control/ Protection: None   Other Topics Concern  . Not on file   Social History Narrative  . No narrative on file     Review of Systems: Constitutional: negative for chills, fever, night sweats or weight changes Cardiovascular: negative for chest pain or palpitations Respiratory: negative for hemoptysis, wheezing, or shortness of breath Abdominal: negative for abdominal pain, nausea, vomiting or diarrhea Dermatological: negative for rash Neurologic: negative for headache   Physical Exam: Blood pressure 120/76, pulse 124, temperature 98.7 F (37.1 C), temperature source Oral, resp. rate 18, height 5' 6.5" (1.689 m), weight 293 lb 12.8 oz (133.267 kg), last menstrual period 03/15/2013, SpO2 97.00%., Body mass index is 46.72 kg/(m^2). General: Well developed, well nourished, in no acute distress. Head: Normocephalic, atraumatic, eyes without discharge, sclera non-icteric, nares are congested. Bilateral auditory canals clear, TM's are without perforation, pearly grey with reflective cone of light bilaterally. No sinus TTP. Oral cavity moist, dentition normal. Posterior pharynx with post nasal drip and mild erythema. No peritonsillar abscess or tonsillar exudate.  Right TM is red and retracted. Neck: Supple. No thyromegaly. Full ROM.  No lymphadenopathy. Lungs: Coarse breath sounds bilaterally without wheezes, rales, or rhonchi. Breathing is unlabored.  Heart: RRR with S1 S2. No murmurs, rubs, or gallops appreciated. Msk:  Strength and tone normal for age. Extremities: No clubbing or cyanosis. No edema. Neuro: Alert and oriented X 3. Moves all extremities spontaneously. CNII-XII grossly in tact. Psych:  Responds to questions appropriately with a normal affect.   [facility] reading (PRIMARY)  by  Dr. Milus Glazier:  CXR.  No infiltrate   ASSESSMENT AND PLAN:  38 y.o. year old female with bronchitis. Acute bronchitis - Plan: albuterol (PROVENTIL HFA;VENTOLIN HFA) 108 (90 BASE) MCG/ACT inhaler, predniSONE (DELTASONE) 20 MG tablet, HYDROcodone-homatropine (HYCODAN) 5-1.5 MG/5ML syrup  Cough - Plan: DG Chest 2 View, albuterol (PROVENTIL HFA;VENTOLIN HFA) 108 (90 BASE) MCG/ACT inhaler, predniSONE (DELTASONE) 20 MG tablet, HYDROcodone-homatropine (HYCODAN) 5-1.5 MG/5ML syrup  Wheezing - Plan: DG Chest 2 View, albuterol (PROVENTIL HFA;VENTOLIN HFA) 108 (90 BASE) MCG/ACT inhaler, predniSONE (DELTASONE) 20 MG tablet, HYDROcodone-homatropine (HYCODAN) 5-1.5 MG/5ML syrup   - -Mucinex -Tylenol/Motrin prn -Rest/fluids -RTC precautions -RTC 3-5 days if no improvement  Signed, [name], MD 03/29/2013 8:13 AM

## 2013-03-29 NOTE — Telephone Encounter (Signed)
Patient callled and said she was seen this morning (around 07:30am) and her pharmacy [facility] did not receive her rx's (she went around 10 am) from Dr. Elbert EwingsL. Confirmed it is [facility]: [facility] [id number] - [place], [place] - [address]  Please resend if possible.   Thank you!  [phone]

## 2013-03-30 NOTE — Telephone Encounter (Signed)
Called pt and she did get these, they went through.

## 2014-02-25 ENCOUNTER — Other Ambulatory Visit: Payer: 59

## 2014-02-25 DIAGNOSIS — Z206 Contact with and (suspected) exposure to human immunodeficiency virus [HIV]: Secondary | ICD-10-CM

## 2014-02-26 LAB — HIV ANTIBODY (ROUTINE TESTING W REFLEX): HIV 1&2 Ab, 4th Generation: NONREACTIVE

## 2014-02-28 LAB — HIV-1 RNA QUANT-NO REFLEX-BLD
HIV 1 RNA Quant: <20 copies/mL (ref <20)
HIV-1 RNA Quant, Log: <1.3 {Log} (ref <1.30)

## 2014-08-26 ENCOUNTER — Other Ambulatory Visit (INDEPENDENT_AMBULATORY_CARE_PROVIDER_SITE_OTHER): Payer: Self-pay | Admitting: Physician Assistant

## 2014-08-26 ENCOUNTER — Ambulatory Visit
Admission: RE | Admit: 2014-08-26 | Discharge: 2014-08-26 | Disposition: A | Discharge status: Home / Self Care | Payer: 59 | Source: Ambulatory Visit | Attending: Physician Assistant | Admitting: Physician Assistant

## 2014-08-26 DIAGNOSIS — Z9884 Bariatric surgery status: Secondary | ICD-10-CM

## 2014-09-14 ENCOUNTER — Ambulatory Visit (INDEPENDENT_AMBULATORY_CARE_PROVIDER_SITE_OTHER): Payer: 59 | Admitting: Family Medicine

## 2014-09-14 VITALS — BP 132/78 | HR 112 | Temp 100.3°F | Resp 20 | Ht 67.0 in | Wt 305.0 lb

## 2014-09-14 DIAGNOSIS — J209 Acute bronchitis, unspecified: Secondary | ICD-10-CM | POA: Diagnosis not present

## 2014-09-14 DIAGNOSIS — R05 Cough: Secondary | ICD-10-CM

## 2014-09-14 DIAGNOSIS — J206 Acute bronchitis due to rhinovirus: Secondary | ICD-10-CM

## 2014-09-14 DIAGNOSIS — R062 Wheezing: Secondary | ICD-10-CM | POA: Diagnosis not present

## 2014-09-14 DIAGNOSIS — R059 Cough, unspecified: Secondary | ICD-10-CM

## 2014-09-14 MED ORDER — HYDROCODONE-HOMATROPINE 5-1.5 MG/5ML PO SYRP: 5.0000 mL | ORAL_SOLUTION | Freq: Three times a day (TID) | ORAL | Status: DC | PRN

## 2014-09-14 MED ORDER — ALBUTEROL SULFATE HFA 108 (90 BASE) MCG/ACT IN AERS: 2.0000 | INHALATION_SPRAY | Freq: Four times a day (QID) | RESPIRATORY_TRACT | Status: DC | PRN

## 2014-09-14 MED ORDER — PREDNISONE 20 MG PO TABS: 40.0000 mg | ORAL_TABLET | Freq: Every day | ORAL | Status: DC

## 2014-09-14 MED ORDER — AZITHROMYCIN 250 MG PO TABS: ORAL_TABLET | ORAL | Status: DC

## 2014-09-14 MED ORDER — ALBUTEROL SULFATE (2.5 MG/3ML) 0.083% IN NEBU
2.5000 mg | INHALATION_SOLUTION | Freq: Once | RESPIRATORY_TRACT | Status: AC
  Administered 2014-09-14: 2.5 mg via RESPIRATORY_TRACT

## 2014-09-14 NOTE — Patient Instructions (Signed)

## 2014-09-14 NOTE — Progress Notes (Signed)
Subjective:    Patient ID: Jessica Yoder, female    DOB: 1975-06-15, 39 y.o.   MRN: [id number] This chart was scribed for Jessica Sidle, MD by Jolene Provost, Medical Scribe. This patient was seen in Room 5 and the patient's care was started a 8:47 PM.  Chief Complaint  Patient presents with   Cough    x 2 weeks, get worse, dry    HPI HPI Comments: ANH BIGOS is a 39 y.o. female who presents to [facility] complaining of a productive cough for the last two weeks. She denies fever or chills. Pt states her sx have become worse today, to the point that she vomited due to coughing.   Pt was seen in march, 2015 with the same sx and was diagnosed with acute bronchitis, and was given albuterol and hycodan cough syrup.   Pt works as a Facilities manager for [employer]  Review of Systems  Constitutional: Negative for fever and chills.  HENT: Positive for congestion. Negative for sinus pressure.   Respiratory: Positive for cough and wheezing.   Gastrointestinal: Positive for vomiting. Negative for nausea.       Objective:   Physical Exam  Constitutional: She is oriented to person, place, and time. She appears well-developed and well-nourished. No distress.  HENT:  Head: Normocephalic and atraumatic.  Eyes: Pupils are equal, round, and reactive to light.  Neck: Neck supple.  Cardiovascular: Normal rate.   Pulmonary/Chest: Effort normal. No respiratory distress.  Musculoskeletal: Normal range of motion.  Neurological: She is alert and oriented to person, place, and time. Coordination normal.  Skin: Skin is warm and dry. She is not diaphoretic.  Psychiatric: She has a normal mood and affect. Her behavior is normal.  Nursing note and vitals reviewed.  patient has expiratory wheezes and a violent cough  Filed Vitals:   09/14/14 2032  BP: 132/78  Pulse: 112  Temp: 100.3 F (37.9 C)  Resp: 20  Height: 5\' 7"  (1.702 m)  Weight: 305 lb (138.347 kg)  SpO2: 97%       Assessment & Plan:    This chart was scribed in my presence and reviewed by me personally.    ICD-9-CM ICD-10-CM   1. Acute bronchitis due to Rhinovirus 466.0 J20.6 albuterol (PROVENTIL) (2.5 MG/3ML) 0.083% nebulizer solution 2.5 mg   079.3  azithromycin (ZITHROMAX) 250 MG tablet     DISCONTINUED: azithromycin (ZITHROMAX) 250 MG tablet  2. Cough 786.2 R05 HYDROcodone-homatropine (HYCODAN) 5-1.5 MG/5ML syrup     albuterol (PROVENTIL) (2.5 MG/3ML) 0.083% nebulizer solution 2.5 mg     predniSONE (DELTASONE) 20 MG tablet     albuterol (PROVENTIL HFA;VENTOLIN HFA) 108 (90 BASE) MCG/ACT inhaler     DISCONTINUED: albuterol (PROVENTIL HFA;VENTOLIN HFA) 108 (90 BASE) MCG/ACT inhaler     DISCONTINUED: predniSONE (DELTASONE) 20 MG tablet  3. Wheezing 786.07 R06.2 HYDROcodone-homatropine (HYCODAN) 5-1.5 MG/5ML syrup     albuterol (PROVENTIL) (2.5 MG/3ML) 0.083% nebulizer solution 2.5 mg     predniSONE (DELTASONE) 20 MG tablet     albuterol (PROVENTIL HFA;VENTOLIN HFA) 108 (90 BASE) MCG/ACT inhaler     DISCONTINUED: albuterol (PROVENTIL HFA;VENTOLIN HFA) 108 (90 BASE) MCG/ACT inhaler     DISCONTINUED: predniSONE (DELTASONE) 20 MG tablet  4. Acute bronchitis, unspecified organism 466.0 J20.9 HYDROcodone-homatropine (HYCODAN) 5-1.5 MG/5ML syrup     albuterol (PROVENTIL) (2.5 MG/3ML) 0.083% nebulizer solution 2.5 mg     predniSONE (DELTASONE) 20 MG tablet     albuterol (PROVENTIL HFA;VENTOLIN HFA) 108 (90  BASE) MCG/ACT inhaler     DISCONTINUED: albuterol (PROVENTIL HFA;VENTOLIN HFA) 108 (90 BASE) MCG/ACT inhaler     DISCONTINUED: predniSONE (DELTASONE) 20 MG tablet     Signed, Jessica Sidle, MD

## 2014-11-15 IMAGING — CR DG CHEST 2V
2 series · 2 of 2 positions shown · non-contrast
Comparison: 06/25/2011

CLINICAL DATA: Cough.

EXAM:
CHEST  2 VIEW

[PA]
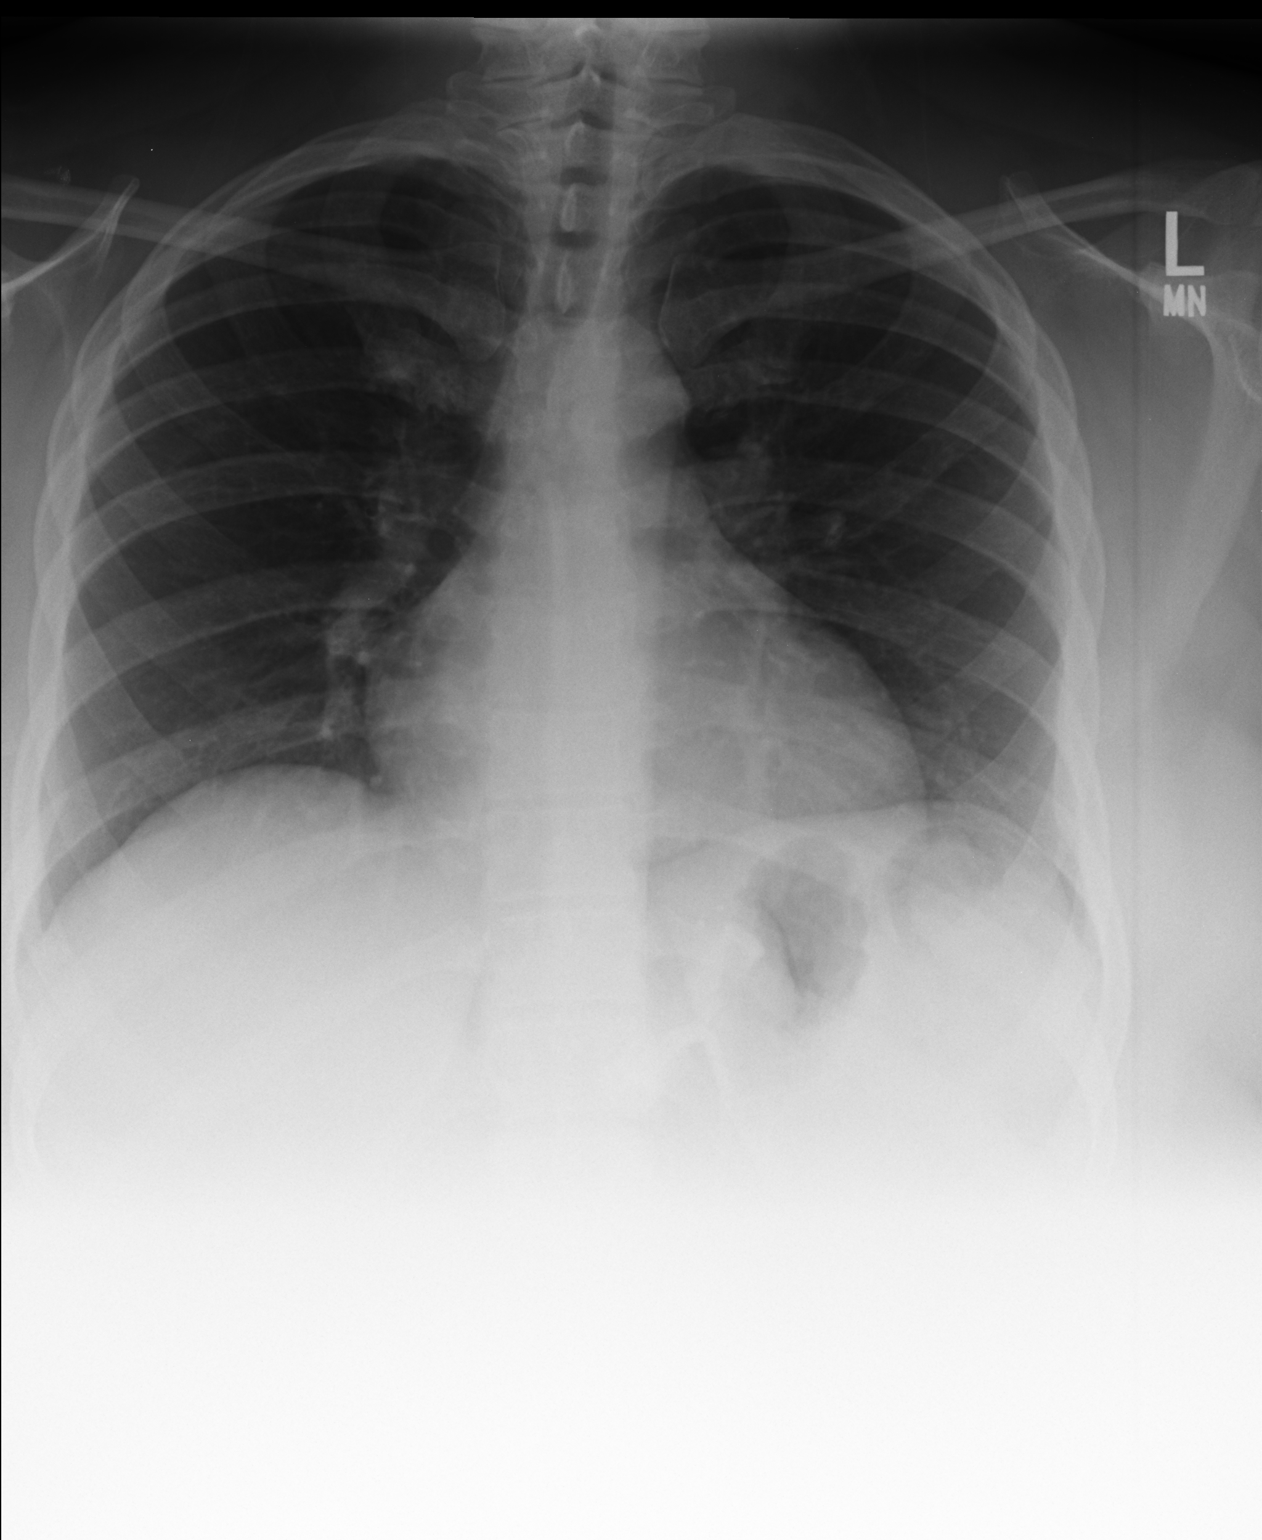

[lateral]
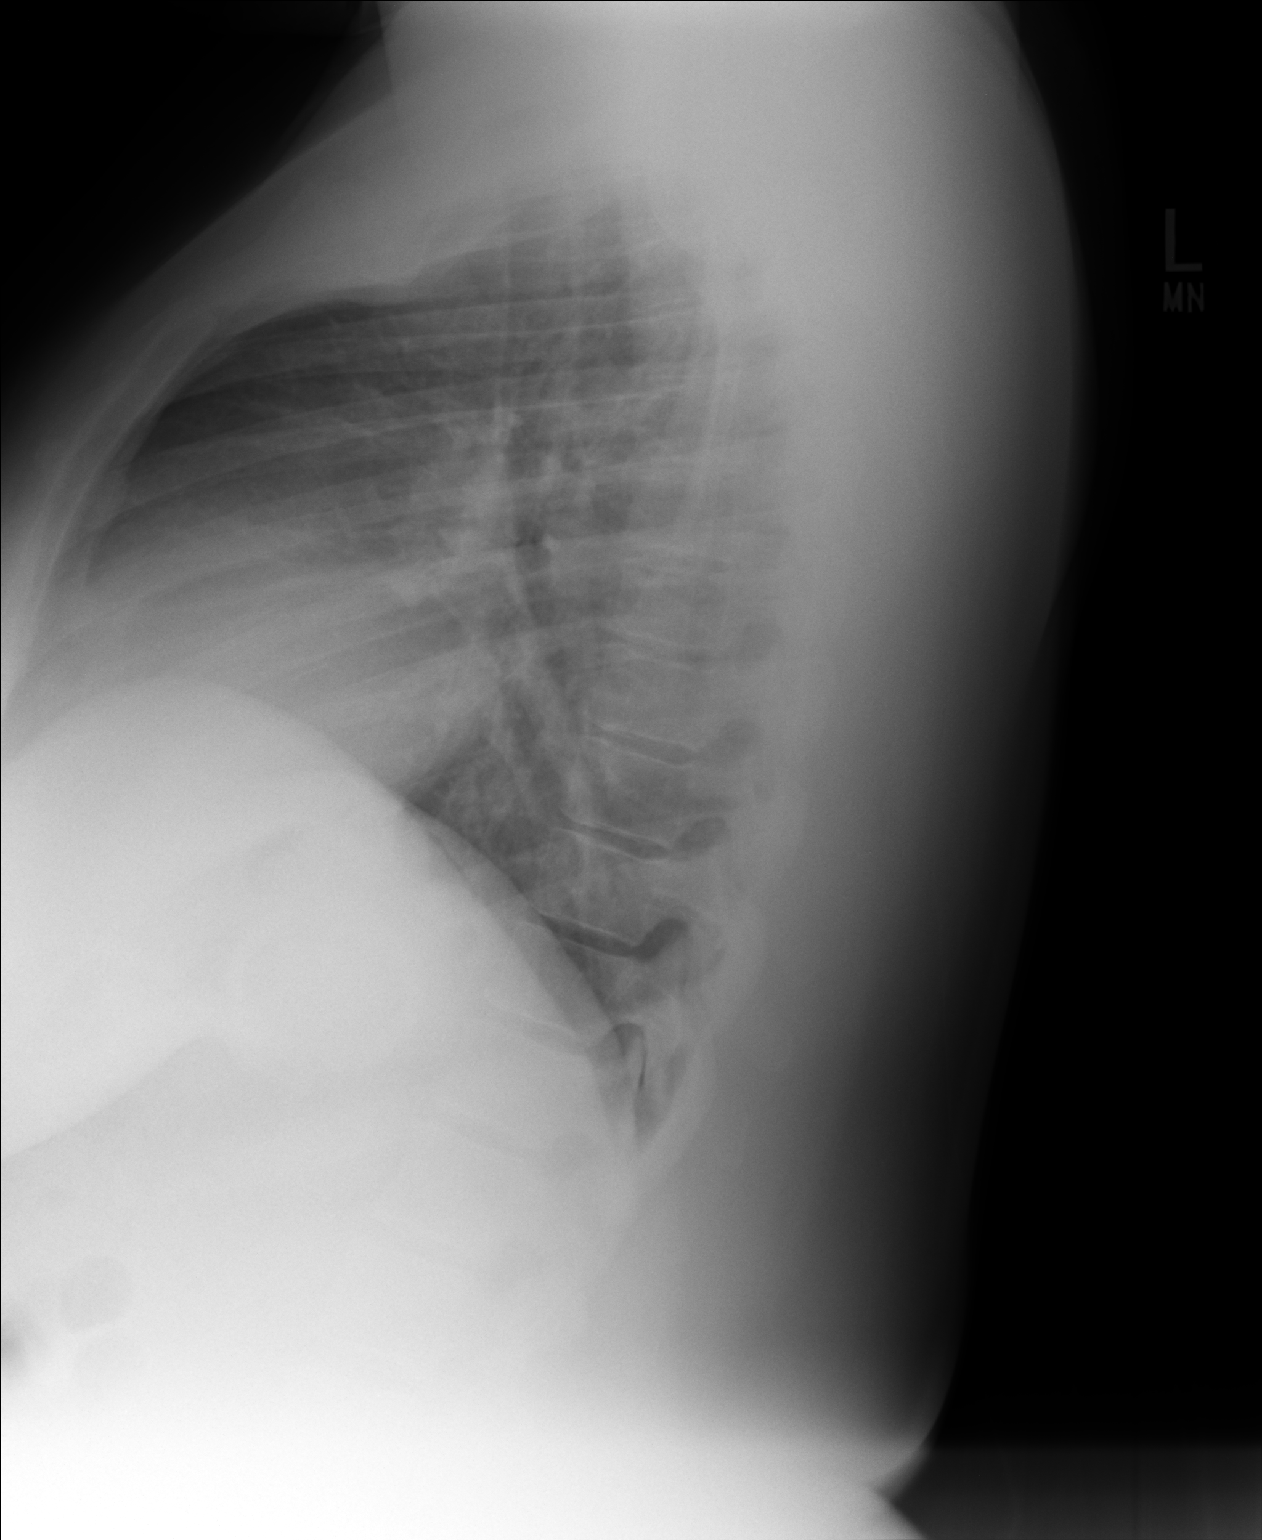

[2 of 2 positions shown; findings below may reference images not displayed]

FINDINGS: Heart and mediastinal contours are within normal limits. No focal
opacities or effusions. No acute bony abnormality.
IMPRESSION: No active cardiopulmonary disease.

## 2014-12-12 ENCOUNTER — Encounter (HOSPITAL_COMMUNITY): Payer: Self-pay | Admitting: Emergency Medicine

## 2014-12-12 ENCOUNTER — Emergency Department (HOSPITAL_COMMUNITY)
Admission: EM | Admit: 2014-12-12 | Discharge: 2014-12-12 | Disposition: A | Discharge status: Home / Self Care | Payer: 59 | Source: Home / Self Care

## 2014-12-12 DIAGNOSIS — K529 Noninfective gastroenteritis and colitis, unspecified: Secondary | ICD-10-CM

## 2014-12-12 LAB — POCT I-STAT, CHEM 8
BUN: 8 mg/dL (ref 6–20)
Calcium, Ion: 1.05 mmol/L — ABNORMAL LOW (ref 1.12–1.23)
Chloride: 99 mmol/L — ABNORMAL LOW (ref 101–111)
Creatinine, Ser: 0.7 mg/dL (ref 0.44–1.00)
Glucose, Bld: 105 mg/dL — ABNORMAL HIGH (ref 65–99)
HCT: 48 % — ABNORMAL HIGH (ref 36.0–46.0)
Hemoglobin: 16.3 g/dL — ABNORMAL HIGH (ref 12.0–15.0)
Potassium: 3.3 mmol/L — ABNORMAL LOW (ref 3.5–5.1)
Sodium: 139 mmol/L (ref 135–145)
TCO2: 25 mmol/L (ref 0–100)

## 2014-12-12 LAB — POCT URINALYSIS DIP (DEVICE)
Bilirubin Urine: NEGATIVE
Glucose, UA: NEGATIVE mg/dL
Ketones, ur: 40 mg/dL — AB
Leukocytes, UA: NEGATIVE
Nitrite: NEGATIVE
Protein, ur: 30 mg/dL — AB
Specific Gravity, Urine: >=1.03 (ref 1.005–1.030)
Urobilinogen, UA: 0.2 mg/dL (ref 0.0–1.0)
pH: 5.5 (ref 5.0–8.0)

## 2014-12-12 LAB — POCT PREGNANCY, URINE: Preg Test, Ur: NEGATIVE

## 2014-12-12 MED ORDER — ONDANSETRON HCL 4 MG/2ML IJ SOLN
4.0000 mg | Freq: Once | INTRAMUSCULAR | Status: AC
  Administered 2014-12-12: 4 mg via INTRAVENOUS

## 2014-12-12 MED ORDER — ONDANSETRON HCL 4 MG/2ML IJ SOLN
INTRAMUSCULAR | Status: AC
  Filled 2014-12-12: qty 2

## 2014-12-12 MED ORDER — SODIUM CHLORIDE 0.9 % IV SOLN
Freq: Once | INTRAVENOUS | Status: AC
  Administered 2014-12-12: 17:00:00 via INTRAVENOUS

## 2014-12-12 MED ORDER — ONDANSETRON HCL 4 MG/2ML IJ SOLN: 4.0000 mg | Freq: Once | INTRAMUSCULAR | Status: DC

## 2014-12-12 MED ORDER — ONDANSETRON 4 MG PREPACK (~~LOC~~): ORAL_TABLET | ORAL | Status: DC

## 2014-12-12 NOTE — ED Provider Notes (Signed)
CSN: [id number]     Arrival date & time 12/12/14  1617 History   None    Chief Complaint  Patient presents with  . Abdominal Pain   (Consider location/radiation/quality/duration/timing/severity/associated sxs/prior Treatment) HPI History obtained from patient:   LOCATION: Abdomen SEVERITY: 6 DURATION: Onset Friday CONTEXT: Initially nausea since yesterday with vomiting and diarrhea QUALITY: MODIFYING FACTORS: Pepto-Bismol, Imodium without relief. ASSOCIATED SYMPTOMS: Not able to keep anything down solid foods or fluid TIMING: Constant approximately 13 episodes. OCCUPATION:  Past Medical History  Diagnosis Date  . Allergy   . Migraine    Past Surgical History  Procedure Laterality Date  . Laparoscopic gastric banding  06/28/2008  . Eye surgery  2011    Lasik   Family History  Problem Relation Age of Onset  . Other Father     ETOH Abuse  . Cancer Other   . Hyperlipidemia Other   . Stroke Other   . Diabetes Other   . Obesity Other   . Sleep apnea Other    Social History  Substance Use Topics  . Smoking status: Never Smoker   . Smokeless tobacco: None  . Alcohol Use: Yes     Comment: socially   OB History    No data available     Review of Systems ROS +'ve nausea, vomiting, diarrhea, abdominal pain, headache  Denies:   CHEST PAIN, CONGESTION, DYSURIA, SHORTNESS OF BREATH  Allergies  Amitriptyline and Latex  Home Medications   Prior to Admission medications   Medication Sig Start Date End Date Taking? Authorizing Provider  albuterol (PROVENTIL HFA;VENTOLIN HFA) 108 (90 BASE) MCG/ACT inhaler Inhale 2 puffs into the lungs every 6 (six) hours as needed for wheezing or shortness of breath. 09/14/14   Elvina SidleKurt Lauenstein, MD  azithromycin (ZITHROMAX) 250 MG tablet Take 2 tabs PO x 1 dose, then 1 tab PO QD x 4 days 09/14/14   Elvina SidleKurt Lauenstein, MD  Calcium Carbonate-Vitamin D (CALCIUM-VITAMIN D) 500-200 MG-UNIT per tablet Take 1 tablet by mouth 2 (two) times daily with  a meal. Patient takes 600 mg     Historical Provider, MD  HYDROcodone-homatropine (HYCODAN) 5-1.5 MG/5ML syrup Take 5 mLs by mouth every 8 (eight) hours as needed for cough. 09/14/14   Elvina SidleKurt Lauenstein, MD  minocycline (MINOCIN,DYNACIN) 100 MG capsule Take 1 capsule (100 mg total) by mouth 2 (two) times daily. Patient not taking: Reported on 09/14/2014 03/25/13   Linna HoffJames D Kindl, MD  Multiple Vitamin (MULTIVITAMIN) tablet Take 1 tablet by mouth daily.      Historical Provider, MD  predniSONE (DELTASONE) 20 MG tablet Take 2 tablets (40 mg total) by mouth daily. 09/14/14   Elvina SidleKurt Lauenstein, MD  ranitidine (ZANTAC) 150 MG tablet Take 150 mg by mouth 2 (two) times daily.    Historical Provider, MD   Meds Ordered and Administered this Visit   Medications  0.9 %  sodium chloride infusion (not administered)  ondansetron (ZOFRAN) injection 4 mg (not administered)    LMP 11/16/2014 No data found.   Physical Exam  Constitutional: She is oriented to person, place, and time. She appears well-developed and well-nourished. No distress.  HENT:  Head: Normocephalic and atraumatic.  Right Ear: External ear normal.  Left Ear: External ear normal.  Mouth/Throat: No oropharyngeal exudate.  Oral mucosa mildly dry.  Eyes: Conjunctivae are normal.  Neck: Normal range of motion. Neck supple.  Cardiovascular: Normal rate.   Pulmonary/Chest: Effort normal and breath sounds normal. No respiratory distress.  Abdominal: Soft. She  exhibits no abdominal bruit. There is no hepatosplenomegaly. There is no tenderness. There is no rebound and no CVA tenderness. No hernia.  Musculoskeletal: Normal range of motion.  Neurological: She is alert and oriented to person, place, and time.  Skin: Skin is warm and dry.  Normal skin turgor  Psychiatric: She has a normal mood and affect. Her behavior is normal. Judgment and thought content normal.  Nursing note and vitals reviewed.   ED Course  Procedures (including critical care  time)  Labs Review Labs Reviewed  POCT I-STAT, CHEM 8 - Abnormal; Notable for the following:    Potassium 3.3 (*)    Chloride 99 (*)    Glucose, Bld 105 (*)    Calcium, Ion 1.05 (*)    Hemoglobin 16.3 (*)    HCT 48.0 (*)    All other components within normal limits  POCT URINALYSIS DIP (DEVICE) - Abnormal; Notable for the following:    Ketones, ur 40 (*)    Hgb urine dipstick MODERATE (*)    Protein, ur 30 (*)    All other components within normal limits  POCT PREGNANCY, URINE    Imaging Review No results found.   Visual Acuity Review  Right Eye Distance:   Left Eye Distance:   Bilateral Distance:    Right Eye Near:   Left Eye Near:    Bilateral Near:         MDM   1. Acute gastroenteritis    Pt states she is feeling so much better after fluids and zofran. Able to tolerate liquids prior to discharge.   Prescription for Zofran is provided to the patient. Continue to push fluids and bland diet and follow with her primary care provider if symptoms persist over the next 3 days. Instructions were care provided discharged home in stable condition.     Tharon Aquas, PA 12/12/14 6213

## 2014-12-12 NOTE — ED Notes (Signed)
C/o abd pain onset Friday associated w/n/v/d... Reports she has had 13 episodes of emesis and diarrhea yest and x6-7 episodes of emesis and diarrhea today.  A&O x4 .... No acute distress.

## 2014-12-12 NOTE — Discharge Instructions (Signed)

## 2015-03-23 ENCOUNTER — Ambulatory Visit (INDEPENDENT_AMBULATORY_CARE_PROVIDER_SITE_OTHER): Payer: 59 | Admitting: Physician Assistant

## 2015-03-23 VITALS — BP 114/76 | HR 81 | Temp 98.2°F | Resp 17 | Ht 67.0 in | Wt 325.0 lb

## 2015-03-23 DIAGNOSIS — R6889 Other general symptoms and signs: Secondary | ICD-10-CM | POA: Diagnosis not present

## 2015-03-23 DIAGNOSIS — A084 Viral intestinal infection, unspecified: Secondary | ICD-10-CM | POA: Diagnosis not present

## 2015-03-23 LAB — POCT INFLUENZA A/B
Influenza A, POC: NEGATIVE
Influenza B, POC: NEGATIVE

## 2015-03-23 MED ORDER — ONDANSETRON 8 MG PO TBDP: 8.0000 mg | ORAL_TABLET | Freq: Three times a day (TID) | ORAL | Status: DC | PRN

## 2015-03-23 MED ORDER — IBUPROFEN 200 MG PO TABS
600.0000 mg | ORAL_TABLET | Freq: Once | ORAL | Status: AC
  Administered 2015-03-23: 600 mg via ORAL

## 2015-03-23 MED ORDER — HYDROCOD POLST-CPM POLST ER 10-8 MG/5ML PO SUER: 5.0000 mL | Freq: Every evening | ORAL | Status: DC | PRN

## 2015-03-23 NOTE — Progress Notes (Signed)
[facility] [address], [place] [address] [phone]  Date:  03/23/2015   Name:  Jessica Yoder   DOB:  Oct 28, 1975   MRN:  [id number]  PCP:  Astrid Divine, MD   Chief Complaint  Patient presents with  . Cough  . Laryngitis  . Nausea  . Emesis  . Dizziness    History of Present Illness:  STEELE Yoder is a 40 y.o. female patient who presents to [facility] for 4 days of hoarseness, cough and congestion, and new onset of emesis.  4 days ago, she developed sore throat and hoarseness.  She has cough and congestion.  Nausea and emesis started today without abdominal pain.  She has a very low appetite.  She feels weak.  There was some dizziness in the morning, but this has dissipated.  Vomiting non-bloody or bilious.   She has attempted robitussin and theraflu which have helped very little.     She does not hydrate well.  Sick contact of husband    Patient Active Problem List   Diagnosis Date Noted  . Lapband APL June 2010 08/22/2011    Past Medical History  Diagnosis Date  . Allergy   . Migraine     Past Surgical History  Procedure Laterality Date  . Laparoscopic gastric banding  06/28/2008  . Eye surgery  2011    Lasik    Social History  Substance Use Topics  . Smoking status: Never Smoker   . Smokeless tobacco: Not on file  . Alcohol Use: Yes     Comment: socially    Family History  Problem Relation Age of Onset  . Other Father     ETOH Abuse  . Cancer Other   . Hyperlipidemia Other   . Stroke Other   . Diabetes Other   . Obesity Other   . Sleep apnea Other     Allergies  Allergen Reactions  . Amitriptyline Rash  . Latex Rash    Medication list has been reviewed and updated.  Current Outpatient Prescriptions on File Prior to Visit  Medication Sig Dispense Refill  . Calcium Carbonate-Vitamin D (CALCIUM-VITAMIN D) 500-200 MG-UNIT per tablet Take 1 tablet by mouth 2 (two) times daily with a meal. Patient takes 600 mg      . Multiple Vitamin (MULTIVITAMIN) tablet Take 1 tablet by mouth daily.       No current facility-administered medications on file prior to visit.    ROS   Physical Examination: BP 114/76 mmHg  Pulse 81  Temp(Src) 98.2 F (36.8 C) (Oral)  Resp 17  Ht  (1.702 m)  Wt 325 lb (147.419 kg)  BMI 50.89 kg/m2  SpO2 98%  LMP 03/02/2015 Ideal Body Weight: Weight in (lb) to have BMI = 25: 159.3  Physical Exam  Constitutional: She is oriented to person, place, and time. She appears well-developed and well-nourished. No distress.  HENT:  Head: Normocephalic and atraumatic.  Right Ear: Tympanic membrane, external ear and ear canal normal.  Left Ear: Tympanic membrane, external ear and ear canal normal.  Nose: Mucosal edema and rhinorrhea present. Right sinus exhibits no maxillary sinus tenderness and no frontal sinus tenderness. Left sinus exhibits no maxillary sinus tenderness and no frontal sinus tenderness.  Mouth/Throat: No uvula swelling. No oropharyngeal exudate, posterior oropharyngeal edema or posterior oropharyngeal erythema.  Eyes: Conjunctivae and EOM are normal. Pupils are equal, round, and reactive to light.  Cardiovascular: Normal rate and regular rhythm.  Exam reveals no gallop, no distant heart sounds and no friction rub.   No murmur heard. Pulmonary/Chest: Effort normal. No respiratory distress. She has no decreased breath sounds. She has no wheezes. She has no rhonchi.  Abdominal: Soft. Normal appearance. There is no tenderness. There is no tenderness at McBurney's point and negative Murphy's sign.  Lymphadenopathy:       Head (right side): No submandibular, no tonsillar, no preauricular and no posterior auricular adenopathy present.       Head (left side): No submandibular, no tonsillar, no preauricular and no posterior auricular adenopathy present.  Neurological: She is alert and oriented to person, place, and time.  Skin: She is not diaphoretic.  Psychiatric: She  has a normal mood and affect. Her behavior is normal.     Results for orders placed or performed in visit on 03/23/15  POCT Influenza A/B  Result Value Ref Range   Influenza A, POC Negative Negative   Influenza B, POC Negative Negative    Assessment and Plan: HAISLEY ARENS is a 40 y.o. female who is here today body aches chills, nausea, and emesis. Viral likely.  She declines bloodwork today.  Given zofran to help nausea.  This appears viral and should treat supportively.  Anti-pyretic and heavy hydration advised.   Advised to not return to work today  Viral intestinal infection - Plan: ondansetron (ZOFRAN-ODT) 8 MG disintegrating tablet, chlorpheniramine-HYDROcodone (TUSSIONEX PENNKINETIC ER) 10-8 MG/5ML SUER  Flu-like symptoms - Plan: POCT Influenza A/B, ibuprofen (ADVIL,MOTRIN) tablet 600 mg, CANCELED: CBC   Trena Platt, PA-C [facility] 03/23/2015 12:46 PM

## 2015-03-23 NOTE — Patient Instructions (Addendum)
I would like you to hydrate well with 64 oz of water, or more.    Please take tylenol or ibuprofen for pain or fever. This looks like a virus, and will need to run its course.  If you continue to have the symptoms after 5 more days, then let us know.  Continue the robitussin.

## 2015-03-25 DIAGNOSIS — E669 Obesity, unspecified: Secondary | ICD-10-CM | POA: Diagnosis not present

## 2015-03-25 DIAGNOSIS — Z131 Encounter for screening for diabetes mellitus: Secondary | ICD-10-CM | POA: Diagnosis not present

## 2015-03-25 DIAGNOSIS — Z01419 Encounter for gynecological examination (general) (routine) without abnormal findings: Secondary | ICD-10-CM | POA: Diagnosis not present

## 2015-03-25 DIAGNOSIS — E282 Polycystic ovarian syndrome: Secondary | ICD-10-CM | POA: Diagnosis not present

## 2015-03-25 DIAGNOSIS — Z1321 Encounter for screening for nutritional disorder: Secondary | ICD-10-CM | POA: Diagnosis not present

## 2015-03-25 DIAGNOSIS — Z1329 Encounter for screening for other suspected endocrine disorder: Secondary | ICD-10-CM | POA: Diagnosis not present

## 2015-03-25 DIAGNOSIS — Z6841 Body Mass Index (BMI) 40.0 and over, adult: Secondary | ICD-10-CM | POA: Diagnosis not present

## 2015-03-25 DIAGNOSIS — Z124 Encounter for screening for malignant neoplasm of cervix: Secondary | ICD-10-CM | POA: Diagnosis not present

## 2015-03-25 DIAGNOSIS — N926 Irregular menstruation, unspecified: Secondary | ICD-10-CM | POA: Diagnosis not present

## 2015-03-25 DIAGNOSIS — Z113 Encounter for screening for infections with a predominantly sexual mode of transmission: Secondary | ICD-10-CM | POA: Diagnosis not present

## 2015-03-25 DIAGNOSIS — Z13220 Encounter for screening for lipoid disorders: Secondary | ICD-10-CM | POA: Diagnosis not present

## 2015-03-29 ENCOUNTER — Encounter: Payer: Self-pay | Admitting: Physician Assistant

## 2015-04-01 DIAGNOSIS — N915 Oligomenorrhea, unspecified: Secondary | ICD-10-CM | POA: Diagnosis not present

## 2015-04-01 DIAGNOSIS — E282 Polycystic ovarian syndrome: Secondary | ICD-10-CM | POA: Diagnosis not present

## 2015-04-01 DIAGNOSIS — Z1231 Encounter for screening mammogram for malignant neoplasm of breast: Secondary | ICD-10-CM | POA: Diagnosis not present

## 2015-04-12 DIAGNOSIS — R7301 Impaired fasting glucose: Secondary | ICD-10-CM | POA: Diagnosis not present

## 2015-04-12 DIAGNOSIS — E785 Hyperlipidemia, unspecified: Secondary | ICD-10-CM | POA: Diagnosis not present

## 2016-01-17 DIAGNOSIS — N912 Amenorrhea, unspecified: Secondary | ICD-10-CM | POA: Diagnosis not present

## 2016-01-17 DIAGNOSIS — Z6841 Body Mass Index (BMI) 40.0 and over, adult: Secondary | ICD-10-CM | POA: Diagnosis not present

## 2016-01-17 DIAGNOSIS — Z3202 Encounter for pregnancy test, result negative: Secondary | ICD-10-CM | POA: Diagnosis not present

## 2016-01-17 DIAGNOSIS — E669 Obesity, unspecified: Secondary | ICD-10-CM | POA: Diagnosis not present

## 2016-01-17 MED FILL — MEDROXYPROGESTERONE 10 MG T: 10 | 10 days supply | Qty: 10 | Fill #0

## 2016-01-25 ENCOUNTER — Encounter (HOSPITAL_COMMUNITY): Payer: Self-pay

## 2016-03-22 DIAGNOSIS — R635 Abnormal weight gain: Secondary | ICD-10-CM | POA: Diagnosis not present

## 2016-03-22 DIAGNOSIS — E559 Vitamin D deficiency, unspecified: Secondary | ICD-10-CM | POA: Diagnosis not present

## 2016-03-22 DIAGNOSIS — Z6841 Body Mass Index (BMI) 40.0 and over, adult: Secondary | ICD-10-CM | POA: Diagnosis not present

## 2016-03-22 DIAGNOSIS — E282 Polycystic ovarian syndrome: Secondary | ICD-10-CM | POA: Diagnosis not present

## 2016-03-22 MED FILL — MEDROXYPROGESTERONE 10 MG T: 10 | 10 days supply | Qty: 10 | Fill #0

## 2016-03-26 MED FILL — VIT D2 1.25 MG (50,000 UNIT: 1.25 MG | 28 days supply | Qty: 8 | Fill #0

## 2016-04-03 MED FILL — UNIFINE PENTIPS 31GX3/16": 31G X 5 MM | 90 days supply | Qty: 100 | Fill #0

## 2016-04-03 MED FILL — UNIFINE PENTIPS 31GX3/16: 31G X 5 MM | 90 days supply | Qty: 100 | Fill #0

## 2016-04-09 MED FILL — SAXENDA 18 MG/3 ML PEN: 18 | 30 days supply | Qty: 15 | Fill #0

## 2016-04-12 ENCOUNTER — Encounter (INDEPENDENT_AMBULATORY_CARE_PROVIDER_SITE_OTHER): Payer: 59 | Admitting: Family Medicine

## 2016-04-13 IMAGING — CR DG ABDOMEN 1V
1 series · 1 of 1 positions shown · non-contrast
Comparison: 06/29/2008.

CLINICAL DATA: Lap band.

EXAM:
ABDOMEN - 1 VIEW

[t abdomen supine]
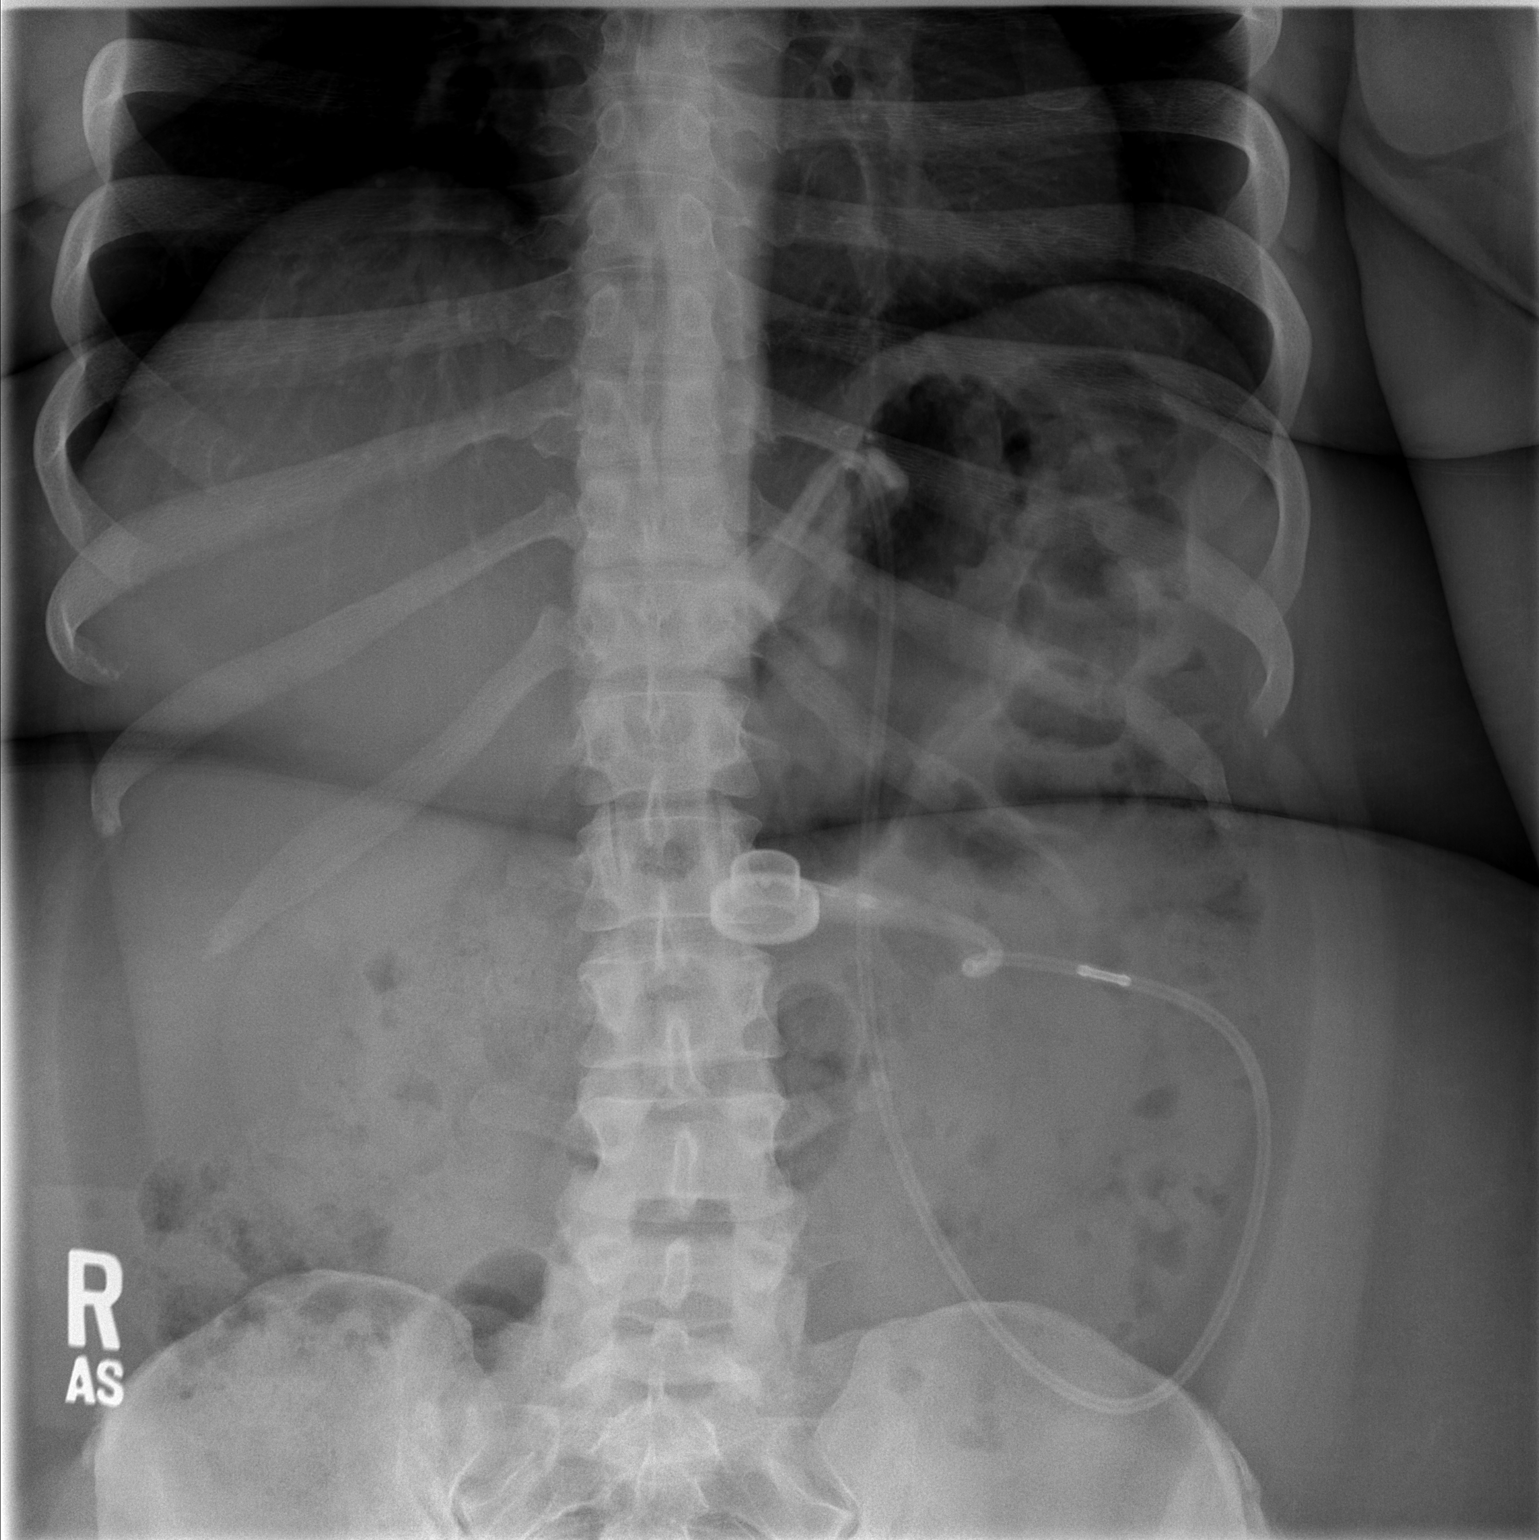

[1 of 1 positions shown; findings below may reference images not displayed]

FINDINGS: Lap band noted in stable position. No bowel distention. No free air.
No acute bony abnormality .
IMPRESSION: 1. Lap band in stable position.
2. No acute abnormality.

## 2016-05-21 MED FILL — SAXENDA 18 MG/3 ML PEN: 18 | 30 days supply | Qty: 15 | Fill #1

## 2016-06-29 DIAGNOSIS — Z6841 Body Mass Index (BMI) 40.0 and over, adult: Secondary | ICD-10-CM | POA: Diagnosis not present

## 2016-06-29 DIAGNOSIS — Z1231 Encounter for screening mammogram for malignant neoplasm of breast: Secondary | ICD-10-CM | POA: Diagnosis not present

## 2016-06-29 DIAGNOSIS — Z01419 Encounter for gynecological examination (general) (routine) without abnormal findings: Secondary | ICD-10-CM | POA: Diagnosis not present

## 2016-07-17 DIAGNOSIS — Z6841 Body Mass Index (BMI) 40.0 and over, adult: Secondary | ICD-10-CM | POA: Diagnosis not present

## 2016-07-17 DIAGNOSIS — R7309 Other abnormal glucose: Secondary | ICD-10-CM | POA: Diagnosis not present

## 2016-07-17 DIAGNOSIS — N926 Irregular menstruation, unspecified: Secondary | ICD-10-CM | POA: Diagnosis not present

## 2016-07-23 DIAGNOSIS — Z30430 Encounter for insertion of intrauterine contraceptive device: Secondary | ICD-10-CM | POA: Diagnosis not present

## 2016-07-31 MED FILL — UNIFINE PENTIPS 8MM 31G: 31G X 8 MM | 90 days supply | Qty: 100 | Fill #0

## 2016-07-31 MED FILL — SAXENDA 18 MG/3 ML PEN: 18 | 30 days supply | Qty: 15 | Fill #0

## 2016-07-31 MED FILL — VIT D2 1.25 MG (50,000 UNIT: 1.25 MG | 84 days supply | Qty: 24 | Fill #0

## 2016-09-04 MED FILL — VENTOLIN HFA 90 MCG INHALER: 108 (90 BAS | 25 days supply | Qty: 18 | Fill #0

## 2016-09-04 MED FILL — AZITHROMYCIN 250 MG TABLET: 250 | 5 days supply | Qty: 6 | Fill #0

## 2016-09-10 DIAGNOSIS — J309 Allergic rhinitis, unspecified: Secondary | ICD-10-CM | POA: Diagnosis not present

## 2016-09-10 DIAGNOSIS — Z6841 Body Mass Index (BMI) 40.0 and over, adult: Secondary | ICD-10-CM | POA: Diagnosis not present

## 2016-09-10 DIAGNOSIS — R635 Abnormal weight gain: Secondary | ICD-10-CM | POA: Diagnosis not present

## 2016-09-10 MED FILL — MONTELUKAST SOD 10 MG TAB: 10 | 30 days supply | Qty: 30 | Fill #0

## 2016-09-11 MED FILL — QSYMIA 3.75 MG-23 MG CAP: 3.75-23 | 14 days supply | Qty: 14 | Fill #0

## 2016-10-09 MED FILL — QSYMIA 7.5 MG-46 MG CAPSULE: 7.5-46 | 30 days supply | Qty: 30 | Fill #0

## 2016-10-17 DIAGNOSIS — Z9884 Bariatric surgery status: Secondary | ICD-10-CM | POA: Diagnosis not present

## 2016-10-17 DIAGNOSIS — G43909 Migraine, unspecified, not intractable, without status migrainosus: Secondary | ICD-10-CM | POA: Diagnosis not present

## 2016-10-17 DIAGNOSIS — Z6841 Body Mass Index (BMI) 40.0 and over, adult: Secondary | ICD-10-CM | POA: Diagnosis not present

## 2017-01-17 DIAGNOSIS — M25552 Pain in left hip: Secondary | ICD-10-CM | POA: Diagnosis not present

## 2017-01-17 DIAGNOSIS — M7062 Trochanteric bursitis, left hip: Secondary | ICD-10-CM | POA: Diagnosis not present

## 2017-01-17 MED FILL — predniSONE 10 MG TABS: 10 | 8 days supply | Qty: 20 | Fill #0

## 2017-01-31 ENCOUNTER — Encounter (HOSPITAL_COMMUNITY): Payer: Self-pay

## 2017-05-16 ENCOUNTER — Encounter (INDEPENDENT_AMBULATORY_CARE_PROVIDER_SITE_OTHER): Payer: 59

## 2017-05-23 ENCOUNTER — Encounter (INDEPENDENT_AMBULATORY_CARE_PROVIDER_SITE_OTHER): Payer: 59

## 2017-06-04 ENCOUNTER — Ambulatory Visit (INDEPENDENT_AMBULATORY_CARE_PROVIDER_SITE_OTHER): Payer: 59 | Admitting: Family Medicine

## 2017-06-04 ENCOUNTER — Encounter (INDEPENDENT_AMBULATORY_CARE_PROVIDER_SITE_OTHER): Payer: Self-pay | Admitting: Family Medicine

## 2017-06-04 VITALS — BP 128/72 | HR 78 | Temp 98.4°F | Ht 66.0 in | Wt 332.0 lb

## 2017-06-04 DIAGNOSIS — Z6841 Body Mass Index (BMI) 40.0 and over, adult: Secondary | ICD-10-CM

## 2017-06-04 DIAGNOSIS — R7303 Prediabetes: Secondary | ICD-10-CM | POA: Diagnosis not present

## 2017-06-04 DIAGNOSIS — R5383 Other fatigue: Secondary | ICD-10-CM

## 2017-06-04 DIAGNOSIS — Z9189 Other specified personal risk factors, not elsewhere classified: Secondary | ICD-10-CM

## 2017-06-04 DIAGNOSIS — Z0289 Encounter for other administrative examinations: Secondary | ICD-10-CM

## 2017-06-04 DIAGNOSIS — Z1331 Encounter for screening for depression: Secondary | ICD-10-CM | POA: Diagnosis not present

## 2017-06-04 DIAGNOSIS — R0602 Shortness of breath: Secondary | ICD-10-CM | POA: Diagnosis not present

## 2017-06-05 LAB — CBC WITH DIFFERENTIAL
Basophils Absolute: 0 10*3/uL (ref 0.0–0.2)
Basos: 0 %
EOS (ABSOLUTE): 0.2 10*3/uL (ref 0.0–0.4)
Eos: 3 %
Hematocrit: 40.6 % (ref 34.0–46.6)
Hemoglobin: 13.2 g/dL (ref 11.1–15.9)
Immature Grans (Abs): 0 10*3/uL (ref 0.0–0.1)
Immature Granulocytes: 0 %
Lymphocytes Absolute: 2.6 10*3/uL (ref 0.7–3.1)
Lymphs: 47 %
MCH: 29.4 pg (ref 26.6–33.0)
MCHC: 32.5 g/dL (ref 31.5–35.7)
MCV: 90 fL (ref 79–97)
Monocytes Absolute: 0.2 10*3/uL (ref 0.1–0.9)
Monocytes: 5 %
Neutrophils Absolute: 2.4 10*3/uL (ref 1.4–7.0)
Neutrophils: 45 %
RBC: 4.49 x10E6/uL (ref 3.77–5.28)
RDW: 13.2 % (ref 12.3–15.4)
WBC: 5.4 10*3/uL (ref 3.4–10.8)

## 2017-06-05 LAB — COMPREHENSIVE METABOLIC PANEL
ALT: 14 IU/L (ref 0–32)
AST: 17 IU/L (ref 0–40)
Albumin/Globulin Ratio: 1.9 (ref 1.2–2.2)
Albumin: 4.6 g/dL (ref 3.5–5.5)
Alkaline Phosphatase: 78 IU/L (ref 39–117)
BUN/Creatinine Ratio: 17 (ref 9–23)
BUN: 12 mg/dL (ref 6–24)
Bilirubin Total: <0.2 mg/dL (ref 0.0–1.2)
CO2: 25 mmol/L (ref 20–29)
Calcium: 9.3 mg/dL (ref 8.7–10.2)
Chloride: 105 mmol/L (ref 96–106)
Creatinine, Ser: 0.71 mg/dL (ref 0.57–1.00)
GFR calc Af Amer: 121 mL/min/{1.73_m2} (ref >59)
GFR calc non Af Amer: 105 mL/min/{1.73_m2} (ref >59)
Globulin, Total: 2.4 g/dL (ref 1.5–4.5)
Glucose: 81 mg/dL (ref 65–99)
Potassium: 4.5 mmol/L (ref 3.5–5.2)
Sodium: 142 mmol/L (ref 134–144)
Total Protein: 7 g/dL (ref 6.0–8.5)

## 2017-06-05 LAB — T3: T3, Total: 107 ng/dL (ref 71–180)

## 2017-06-05 LAB — T4, FREE: Free T4: 0.95 ng/dL (ref 0.82–1.77)

## 2017-06-05 LAB — LIPID PANEL WITH LDL/HDL RATIO
Cholesterol, Total: 163 mg/dL (ref 100–199)
HDL: 46 mg/dL (ref >39)
LDL Calculated: 104 mg/dL — ABNORMAL HIGH (ref 0–99)
LDl/HDL Ratio: 2.3 ratio (ref 0.0–3.2)
Triglycerides: 63 mg/dL (ref 0–149)
VLDL Cholesterol Cal: 13 mg/dL (ref 5–40)

## 2017-06-05 LAB — INSULIN, RANDOM: INSULIN: 28.5 u[IU]/mL — ABNORMAL HIGH (ref 2.6–24.9)

## 2017-06-05 LAB — HEMOGLOBIN A1C
Est. average glucose Bld gHb Est-mCnc: 120 mg/dL
Hgb A1c MFr Bld: 5.8 % — ABNORMAL HIGH (ref 4.8–5.6)

## 2017-06-05 LAB — FOLATE: Folate: 8.8 ng/mL (ref >3.0)

## 2017-06-05 LAB — TSH: TSH: 1.55 u[IU]/mL (ref 0.450–4.500)

## 2017-06-05 LAB — VITAMIN D 25 HYDROXY (VIT D DEFICIENCY, FRACTURES): Vit D, 25-Hydroxy: 14 ng/mL — ABNORMAL LOW (ref 30.0–100.0)

## 2017-06-05 LAB — VITAMIN B12: Vitamin B-12: 459 pg/mL (ref 232–1245)

## 2017-06-05 NOTE — Progress Notes (Signed)
.  Office: [phone]  /  Fax: [phone]   HPI:   Chief Complaint: OBESITY  Jessica Yoder (MR# [id number]) is a 42 y.o. female who presents on 06/05/2017 for obesity evaluation and treatment. Current BMI is Body mass index is 53.59 kg/m.Jessica Yoder has struggled with obesity for years and has been unsuccessful in either losing weight or maintaining long term weight loss. Jessica Yoder attended our information session and states she is currently in the action stage of change and ready to dedicate time achieving and maintaining a healthier weight.  Jessica Yoder states her family eats meals together she thinks her family will eat healthier with  her her desired weight loss is 59 lbs she has been heavy most of  her life she started gaining weight in the last 2 yrs when her husband got sick her heaviest weight ever was 334 lbs. she has significant food cravings issues  she snacks frequently in the evenings she skips meals frequently she is frequently drinking liquids with calories she frequently makes poor food choices she frequently eats larger portions than normal  she has binge eating behaviors she struggles with emotional eating   History of Lap Band in 2010 with no restriction Jessica Yoder was previously on Saxenda, but she had injection site rash   Fatigue Jessica Yoder feels her energy is lower than it should be. This has worsened with weight gain and has not worsened recently. Jessica Yoder admits to daytime somnolence and  admits to waking up still tired. Patient is at risk for obstructive sleep apnea. Patent has a history of symptoms of daytime fatigue, morning fatigue and morning headache. Patient generally gets 6 hours of sleep per night, and states they generally have restless sleep. Snoring is present. Apneic episodes are not present. Epworth Sleepiness Score is 10  EKG was ordered today and is within normal limits.  Dyspnea on exertion Jessica Yoder notes increasing shortness of breath with exercising and seems  to be worsening over time with weight gain. She notes getting out of breath sooner with activity than she used to. This has not gotten worse recently. EKG was ordered today and is within normal limits. Jamee denies orthopnea.  Pre-Diabetes Jessica Yoder has a diagnosis of prediabetes based on her elevated Hgb A1c and was informed this puts her at greater risk of developing diabetes. She is not taking metformin currently and continues to work on diet and exercise to decrease risk of diabetes. She denies nausea or hypoglycemia.  Depression Screen Jessica Yoder Food and Mood (modified PHQ-9) score was  Depression screen PHQ 2/9 06/04/2017  Decreased Interest 1  Down, Depressed, Hopeless 1  PHQ - 2 Score 2  Altered sleeping 0  Tired, decreased energy 2  Change in appetite 3  Feeling bad or failure about yourself  2  Trouble concentrating 0  Moving slowly or fidgety/restless 0  Suicidal thoughts 0  PHQ-9 Score 9  Difficult doing work/chores Somewhat difficult    ALLERGIES: Allergies  Allergen Reactions  . Amitriptyline Rash  . Latex Rash  . Saxenda [Liraglutide -Weight Management] Itching    Injection site    MEDICATIONS: Current Outpatient Medications on File Prior to Visit  Medication Sig Dispense Refill  . levonorgestrel (MIRENA) 20 MCG/24HR IUD 1 each by Intrauterine route once.     No current facility-administered medications on file prior to visit.     PAST MEDICAL HISTORY: Past Medical History:  Diagnosis Date  . Allergy   . Elevated hemoglobin A1c   . Migraine   .  Vitamin D deficiency     PAST SURGICAL HISTORY: Past Surgical History:  Procedure Laterality Date  . EYE SURGERY  2011   Lasik  . LAPAROSCOPIC GASTRIC BANDING  06/28/2008    SOCIAL HISTORY: Social History   Tobacco Use  . Smoking status: Never Smoker  . Smokeless tobacco: Never Used  Substance Use Topics  . Alcohol use: Yes    Comment: socially  . Drug use: No    FAMILY HISTORY: Family History    Problem Relation Age of Onset  . Other Father        ETOH Abuse  . Thyroid disease Mother   . Obesity Mother   . Cancer Other   . Hyperlipidemia Other   . Stroke Other   . Diabetes Other   . Obesity Other   . Sleep apnea Other     ROS: Review of Systems  Constitutional: Positive for malaise/fatigue.  Respiratory: Positive for shortness of breath (on exertion).   Cardiovascular: Negative for orthopnea.  Gastrointestinal: Negative for nausea.  Neurological: Positive for headaches.  Endo/Heme/Allergies:       Negative for hypoglycemia    PHYSICAL EXAM: Pulse 78, temperature 98.4 F (36.9 C), temperature source Oral, height  (1.676 m), weight (!) 332 lb (150.6 kg), SpO2 98 %. Body mass index is 53.59 kg/m. Physical Exam  Constitutional: She is oriented to person, place, and time. She appears well-developed and well-nourished.  HENT:  Head: Normocephalic and atraumatic.  Nose: Nose normal.  Eyes: EOM are normal. No scleral icterus.  Neck: Normal range of motion. Neck supple. No thyromegaly present.  Cardiovascular: Normal rate and regular rhythm.  Pulmonary/Chest: Effort normal. No respiratory distress.  Abdominal: Soft. There is no tenderness.  + obesity  Musculoskeletal: Normal range of motion.  Range of Motion normal in all 4 extremities  Neurological: She is alert and oriented to person, place, and time. Coordination normal.  Skin: Skin is warm and dry.  Psychiatric: She has a normal mood and affect. Her behavior is normal.  Vitals reviewed.   RECENT LABS AND TESTS: BMET    Component Value Date/Time   NA 142 06/04/2017 1205   K 4.5 06/04/2017 1205   CL 105 06/04/2017 1205   CO2 25 06/04/2017 1205   GLUCOSE 81 06/04/2017 1205   GLUCOSE 105 (H) 12/12/2014 1724   BUN 12 06/04/2017 1205   CREATININE 0.71 06/04/2017 1205   CALCIUM 9.3 06/04/2017 1205   GFRNONAA 105 06/04/2017 1205   GFRAA 121 06/04/2017 1205   Lab Results  Component Value Date    HGBA1C 5.8 (H) 06/04/2017   Lab Results  Component Value Date   INSULIN 28.5 (H) 06/04/2017   CBC    Component Value Date/Time   WBC 5.4 06/04/2017 1205   WBC 8.9 06/29/2008 0430   RBC 4.49 06/04/2017 1205   RBC 4.28 06/29/2008 0430   HGB 13.2 06/04/2017 1205   HCT 40.6 06/04/2017 1205   PLT 332 06/29/2008 0430   MCV 90 06/04/2017 1205   MCH 29.4 06/04/2017 1205   MCHC 32.5 06/04/2017 1205   MCHC 33.9 06/29/2008 0430   RDW 13.2 06/04/2017 1205   LYMPHSABS 2.6 06/04/2017 1205   MONOABS 0.5 06/29/2008 0430   EOSABS 0.2 06/04/2017 1205   BASOSABS 0.0 06/04/2017 1205   Iron/TIBC/Ferritin/ %Sat No results found for: IRON, TIBC, FERRITIN, IRONPCTSAT Lipid Panel     Component Value Date/Time   CHOL 163 06/04/2017 1205   TRIG 63 06/04/2017 1205  HDL 46 06/04/2017 1205   LDLCALC 104 (H) 06/04/2017 1205   Hepatic Function Panel     Component Value Date/Time   PROT 7.0 06/04/2017 1205   ALBUMIN 4.6 06/04/2017 1205   AST 17 06/04/2017 1205   ALT 14 06/04/2017 1205   ALKPHOS 78 06/04/2017 1205   BILITOT <0.2 06/04/2017 1205      Component Value Date/Time   TSH 1.550 06/04/2017 1205   Vitamin D There are no recent labs in Epic  ECG  shows NSR with a rate of 76 BPM INDIRECT CALORIMETER done today shows a VO2 of 366 and a REE of 2550. Her calculated basal metabolic rate is 1610 thus her basal metabolic rate is better than expected.    ASSESSMENT AND PLAN: Other fatigue - Plan: EKG 12-Lead, CBC With Differential, Comprehensive metabolic panel, Lipid Panel With LDL/HDL Ratio, VITAMIN D 25 Hydroxy (Vit-D Deficiency, Fractures), Vitamin B12, Folate, T3, T4, free, TSH  Shortness of breath on exertion  Prediabetes - Plan: Hemoglobin A1c, Insulin, random  Depression screening  At risk for diabetes mellitus  Class 3 severe obesity with serious comorbidity and body mass index (BMI) of 50.0 to 59.9 in adult, unspecified obesity type (HCC)  PLAN:  Fatigue Missie was  informed that her fatigue may be related to obesity, depression or many other causes. Labs will be ordered, and in the meanwhile Jessica Yoder has agreed to work on diet, exercise and weight loss to help with fatigue. Proper sleep hygiene was discussed including the need for 7-8 hours of quality sleep each night. A sleep study was not ordered based on symptoms and Epworth score. We will order indirect calorimetry.  Dyspnea on exertion Jessica Yoder shortness of breath appears to be obesity related and exercise induced. She has agreed to work on weight loss and gradually increase exercise to treat her exercise induced shortness of breath. If Jessica Yoder follows our instructions and loses weight without improvement of her shortness of breath, we will plan to refer to pulmonology. We will order labs and indirect calorimetry. We will monitor this condition regularly. Jessica Yoder agrees to this plan.  Pre-Diabetes Jessica Yoder will continue to work on weight loss, exercise, and decreasing simple carbohydrates in her diet to help decrease the risk of diabetes. She was informed that eating too many simple carbohydrates or too many calories at one sitting increases the likelihood of GI side effects. We will check Hgb A1c and fasting insulin level today and Jessica Yoder agreed to follow up with [name] as directed to monitor her progress.  Depression Screen Jessica Yoder had a mildly positive depression screening. Depression is commonly associated with obesity and often results in emotional eating behaviors. We will monitor this closely and work on CBT to help improve the non-hunger eating patterns. Referral to Psychology may be required if no improvement is seen as she continues in our clinic.  Obesity Jessica Yoder is currently in the action stage of change and her goal is to continue with weight loss efforts She has agreed to follow the Category 4 plan Jessica Yoder has been instructed to work up to a goal of 150 minutes of combined cardio and strengthening exercise  per week for weight loss and overall health benefits. We discussed the following Behavioral Modification Strategies today: better snacking choices, planning for success, increasing lean protein intake, decreasing simple carbohydrates , increasing vegetables, decrease eating out and work on meal planning and easy cooking plans  Jessica Yoder has agreed to follow up with our clinic in 2 weeks. She was informed  of the importance of frequent follow up visits to maximize her success with intensive lifestyle modifications for her multiple health conditions. She was informed we would discuss her lab results at her next visit unless there is a critical issue that needs to be addressed sooner. Jessica Yoder agreed to keep her next visit at the agreed upon time to discuss these results.    OBESITY BEHAVIORAL INTERVENTION VISIT  Today's visit was # 1 out of 22.  Starting weight: 332 lbs Starting date: 06/04/17 Today's weight : 332 lbs  Today's date: 06/04/2017 Total lbs lost to date: 0 (Patients must lose 7 lbs in the first 6 months to continue with counseling)   ASK: We discussed the diagnosis of obesity with Jessica Yoder today and Jessica Yoder agreed to give [name] permission to discuss obesity behavioral modification therapy today.  ASSESS: Jessica Yoder has the diagnosis of obesity and her BMI today is 53.61 Jessica Yoder is in the action stage of change   ADVISE: Jessica Yoder was educated on the multiple health risks of obesity as well as the benefit of weight loss to improve her health. She was advised of the need for long term treatment and the importance of lifestyle modifications.  AGREE: Multiple dietary modification options and treatment options were discussed and  Thaily agreed to the above obesity treatment plan.   I, [place] Crane, am acting as transcriptionist for Filbert Schilder, MD   I have reviewed the above documentation for accuracy and completeness, and I agree with the above. - Debbra Riding,  MD

## 2017-06-11 ENCOUNTER — Encounter (INDEPENDENT_AMBULATORY_CARE_PROVIDER_SITE_OTHER): Payer: Self-pay | Admitting: Family Medicine

## 2017-06-19 ENCOUNTER — Ambulatory Visit (INDEPENDENT_AMBULATORY_CARE_PROVIDER_SITE_OTHER): Payer: 59 | Admitting: Family Medicine

## 2017-06-19 VITALS — BP 115/70 | HR 81 | Temp 98.6°F | Ht 66.0 in | Wt 329.0 lb

## 2017-06-19 DIAGNOSIS — Z9189 Other specified personal risk factors, not elsewhere classified: Secondary | ICD-10-CM | POA: Diagnosis not present

## 2017-06-19 DIAGNOSIS — E559 Vitamin D deficiency, unspecified: Secondary | ICD-10-CM | POA: Diagnosis not present

## 2017-06-19 DIAGNOSIS — R7303 Prediabetes: Secondary | ICD-10-CM

## 2017-06-19 DIAGNOSIS — Z6841 Body Mass Index (BMI) 40.0 and over, adult: Secondary | ICD-10-CM | POA: Diagnosis not present

## 2017-06-19 MED ORDER — VITAMIN D (ERGOCALCIFEROL) 1.25 MG (50000 UNIT) PO CAPS: 50000.0000 [IU] | ORAL_CAPSULE | ORAL | 0 refills | Status: DC

## 2017-06-19 MED ORDER — METFORMIN HCL 500 MG PO TABS: 500.0000 mg | ORAL_TABLET | Freq: Every day | ORAL | 0 refills | Status: DC

## 2017-06-19 MED FILL — VIT D2 1.25 MG (50,000 UNIT: 1.25 MG | 28 days supply | Qty: 4 | Fill #0

## 2017-06-19 MED FILL — metFORMIN HCL 500 MG TABS: 500 | 30 days supply | Qty: 30 | Fill #0

## 2017-06-19 NOTE — Progress Notes (Signed)
Office: [phone]  /  Fax: [phone]   HPI:   Chief Complaint: OBESITY Jessica Yoder is here to discuss her progress with her obesity treatment plan. She is on the Category 4 plan and is following her eating plan approximately 80 % of the time. She states she is exercising 0 minutes 0 times per week. Jessica Yoder finds weekends to be difficult especially with lunch out on Sundays. Jessica Yoder is she eats microwave meal.  Her weight is (!) 329 lb (149.2 kg) today and has had a weight loss of 3 pounds over a period of 2 weeks since her last visit. She has lost 3 lbs since starting treatment with [name].  Vitamin D Deficiency Jessica Yoder has a diagnosis of vitamin D deficiency. She is not on OTC Vit D and denies nausea, vomiting or muscle weakness.  Pre-Diabetes Jessica Yoder has a diagnosis of pre-diabetes based on her elevated Hgb A1c and was informed this puts her at greater risk of developing diabetes. She was diagnosed 1 year ago, insulin of 28.5. She is not taking metformin currently and continues to work on diet and exercise to decrease risk of diabetes. She denies nausea or hypoglycemia.  At risk for diabetes Jessica Yoder is at higher than average risk for developing diabetes due to her obesity and pre-diabetes. She currently denies polyuria or polydipsia.  ALLERGIES: Allergies  Allergen Reactions  . Amitriptyline Rash  . Latex Rash  . Saxenda [Liraglutide -Weight Management] Itching    Injection site    MEDICATIONS: Current Outpatient Medications on File Prior to Visit  Medication Sig Dispense Refill  . levonorgestrel (MIRENA) 20 MCG/24HR IUD 1 each by Intrauterine route once.     No current facility-administered medications on file prior to visit.     PAST MEDICAL HISTORY: Past Medical History:  Diagnosis Date  . Allergy   . Elevated hemoglobin A1c   . Migraine   . Vitamin D deficiency     PAST SURGICAL HISTORY: Past Surgical History:  Procedure Laterality Date  . EYE SURGERY  2011   Lasik    . LAPAROSCOPIC GASTRIC BANDING  06/28/2008    SOCIAL HISTORY: Social History   Tobacco Use  . Smoking status: Never Smoker  . Smokeless tobacco: Never Used  Substance Use Topics  . Alcohol use: Yes    Comment: socially  . Drug use: No    FAMILY HISTORY: Family History  Problem Relation Age of Onset  . Other Father        ETOH Abuse  . Thyroid disease Mother   . Obesity Mother   . Cancer Other   . Hyperlipidemia Other   . Stroke Other   . Diabetes Other   . Obesity Other   . Sleep apnea Other     ROS: Review of Systems  Constitutional: Positive for weight loss.  Gastrointestinal: Negative for nausea and vomiting.  Genitourinary: Negative for frequency.  Musculoskeletal:       Negative muscle weakness  Endo/Heme/Allergies: Negative for polydipsia.       Negative hypoglycemia    PHYSICAL EXAM: Blood pressure 115/70, pulse 81, temperature 98.6 F (37 C), temperature source Oral, height  (1.676 m), weight (!) 329 lb (149.2 kg), SpO2 97 %. Body mass index is 53.1 kg/m. Physical Exam  Constitutional: She is oriented to person, place, and time. She appears well-developed and well-nourished.  Cardiovascular: Normal rate.  Pulmonary/Chest: Effort normal.  Musculoskeletal: Normal range of motion.  Neurological: She is oriented to person, place, and time.  Skin:  Skin is warm and dry.  Psychiatric: She has a normal mood and affect. Her behavior is normal.  Vitals reviewed.   RECENT LABS AND TESTS: BMET    Component Value Date/Time   NA 142 06/04/2017 1205   K 4.5 06/04/2017 1205   CL 105 06/04/2017 1205   CO2 25 06/04/2017 1205   GLUCOSE 81 06/04/2017 1205   GLUCOSE 105 (H) 12/12/2014 1724   BUN 12 06/04/2017 1205   CREATININE 0.71 06/04/2017 1205   CALCIUM 9.3 06/04/2017 1205   GFRNONAA 105 06/04/2017 1205   GFRAA 121 06/04/2017 1205   Lab Results  Component Value Date   HGBA1C 5.8 (H) 06/04/2017   Lab Results  Component Value Date   INSULIN  28.5 (H) 06/04/2017   CBC    Component Value Date/Time   WBC 5.4 06/04/2017 1205   WBC 8.9 06/29/2008 0430   RBC 4.49 06/04/2017 1205   RBC 4.28 06/29/2008 0430   HGB 13.2 06/04/2017 1205   HCT 40.6 06/04/2017 1205   PLT 332 06/29/2008 0430   MCV 90 06/04/2017 1205   MCH 29.4 06/04/2017 1205   MCHC 32.5 06/04/2017 1205   MCHC 33.9 06/29/2008 0430   RDW 13.2 06/04/2017 1205   LYMPHSABS 2.6 06/04/2017 1205   MONOABS 0.5 06/29/2008 0430   EOSABS 0.2 06/04/2017 1205   BASOSABS 0.0 06/04/2017 1205   Iron/TIBC/Ferritin/ %Sat No results found for: IRON, TIBC, FERRITIN, IRONPCTSAT Lipid Panel     Component Value Date/Time   CHOL 163 06/04/2017 1205   TRIG 63 06/04/2017 1205   HDL 46 06/04/2017 1205   LDLCALC 104 (H) 06/04/2017 1205   Hepatic Function Panel     Component Value Date/Time   PROT 7.0 06/04/2017 1205   ALBUMIN 4.6 06/04/2017 1205   AST 17 06/04/2017 1205   ALT 14 06/04/2017 1205   ALKPHOS 78 06/04/2017 1205   BILITOT <0.2 06/04/2017 1205      Component Value Date/Time   TSH 1.550 06/04/2017 1205  Results for FINLAY, MILLS (MRN [id number]) as of 06/19/2017 17:17  Ref. Range 06/04/2017 12:05  Vitamin D, 25-Hydroxy Latest Ref Range: 30.0 - 100.0 ng/mL 14.0 (L)    ASSESSMENT AND PLAN: Vitamin D deficiency - Plan: Vitamin D, Ergocalciferol, (DRISDOL) 50000 units CAPS capsule  Prediabetes - Plan: metFORMIN (GLUCOPHAGE) 500 MG tablet  At risk for diabetes mellitus  Class 3 severe obesity with serious comorbidity and body mass index (BMI) of 50.0 to 59.9 in adult, unspecified obesity type (HCC)  PLAN:  Vitamin D Deficiency Jessica Yoder was informed that low vitamin D levels contributes to fatigue and are associated with obesity, breast, and colon cancer. Jessica Yoder agrees to start prescription Vit D ,000 IU every week #4 with no refills. She will follow up for routine testing of vitamin D, at least 2-3 times per year. She was informed of the risk of over-replacement  of vitamin D and agrees to not increase her dose unless she discusses this with [name] first. We will retest in 3 months and Jessica Yoder agrees to follow up with our clinic in 2 to 3 weeks.  Pre-Diabetes Jessica Yoder will continue to work on weight loss, exercise, and decreasing simple carbohydrates in her diet to help decrease the risk of diabetes. We dicussed metformin including benefits and risks. She was informed that eating too many simple carbohydrates or too many calories at one sitting increases the likelihood of GI side effects. Jessica Yoder agrees start metformin 500 mg PO q AM #30 with no  refills. Jessica Yoder agrees to follow up with our clinic in 2 to 3 weeks as directed to monitor her progress.  Diabetes risk counselling Jessica Yoder was given extended (30 minutes) diabetes prevention counseling today. She is 42 y.o. female and has risk factors for diabetes including obesity and pre-diabetes. We discussed intensive lifestyle modifications today with an emphasis on weight loss as well as increasing exercise and decreasing simple carbohydrates in her diet.  Obesity Jessica Yoder is currently in the action stage of change. As such, her goal is to continue with weight loss efforts She has agreed to follow the Category 4 plan and keep a food journal with 450-600 calories and 40+ grams of protein at lunch on weekends Jessica Yoder has been instructed to work up to a goal of 150 minutes of combined cardio and strengthening exercise per week for weight loss and overall health benefits. We discussed the following Behavioral Modification Strategies today: increasing lean protein intake, increasing vegetables, work on meal planning and easy cooking plans, and planning for success   Jessica Yoder has agreed to follow up with our clinic in 2 to 3 weeks. She was informed of the importance of frequent follow up visits to maximize her success with intensive lifestyle modifications for her multiple health conditions.   OBESITY BEHAVIORAL INTERVENTION  VISIT  Today's visit was # 2 out of 22.  Starting weight: 332 lbs Starting date: 06/04/17 Today's weight : 329 lbs Today's date: 06/19/2017 Total lbs lost to date: 3 (Patients must lose 7 lbs in the first 6 months to continue with counseling)   ASK: We discussed the diagnosis of obesity with Jessica Yoder today and Jessica Yoder agreed to give [name] permission to discuss obesity behavioral modification therapy today.  ASSESS: Jessica Yoder has the diagnosis of obesity and her BMI today is 53.13 Jessica Yoder is in the action stage of change   ADVISE: Jessica Yoder was educated on the multiple health risks of obesity as well as the benefit of weight loss to improve her health. She was advised of the need for long term treatment and the importance of lifestyle modifications.  AGREE: Multiple dietary modification options and treatment options were discussed and  Jessica Yoder agreed to the above obesity treatment plan.  I, Burt Knack, am acting as transcriptionist for Debbra Riding, MD  I have reviewed the above documentation for accuracy and completeness, and I agree with the above. - Debbra Riding, MD

## 2017-07-04 DIAGNOSIS — E559 Vitamin D deficiency, unspecified: Secondary | ICD-10-CM | POA: Diagnosis not present

## 2017-07-04 DIAGNOSIS — Z6841 Body Mass Index (BMI) 40.0 and over, adult: Secondary | ICD-10-CM | POA: Diagnosis not present

## 2017-07-04 DIAGNOSIS — E282 Polycystic ovarian syndrome: Secondary | ICD-10-CM | POA: Diagnosis not present

## 2017-07-04 DIAGNOSIS — Z0000 Encounter for general adult medical examination without abnormal findings: Secondary | ICD-10-CM | POA: Diagnosis not present

## 2017-07-08 ENCOUNTER — Ambulatory Visit (INDEPENDENT_AMBULATORY_CARE_PROVIDER_SITE_OTHER): Payer: 59 | Admitting: Family Medicine

## 2017-07-08 VITALS — BP 124/78 | HR 79 | Temp 98.0°F | Ht 66.0 in | Wt 329.0 lb

## 2017-07-08 DIAGNOSIS — Z6841 Body Mass Index (BMI) 40.0 and over, adult: Secondary | ICD-10-CM | POA: Diagnosis not present

## 2017-07-08 DIAGNOSIS — E559 Vitamin D deficiency, unspecified: Secondary | ICD-10-CM | POA: Diagnosis not present

## 2017-07-08 DIAGNOSIS — R7303 Prediabetes: Secondary | ICD-10-CM

## 2017-07-08 DIAGNOSIS — Z9189 Other specified personal risk factors, not elsewhere classified: Secondary | ICD-10-CM

## 2017-07-08 MED ORDER — VITAMIN D (ERGOCALCIFEROL) 1.25 MG (50000 UNIT) PO CAPS: 50000.0000 [IU] | ORAL_CAPSULE | ORAL | 0 refills | Status: DC

## 2017-07-09 NOTE — Progress Notes (Signed)
Office: [phone]  /  Fax: [phone]   HPI:   Chief Complaint: OBESITY Jessica Yoder is here to discuss her progress with her obesity treatment plan. She is on the keep a food journal with 450-600 calories and 40+ grams of protein at lunch on weekends and follow the Category 4 plan and is following her eating plan approximately 60 % of the time. She states she is exercising 0 minutes 0 times per week. Jessica Yoder had a few instances of forgetting lunch and then tried to [employer]. Looking for options in case she is in a bind again so she will know what to order.  Her weight is (!) 329 lb (149.2 kg) today and has not lost weight since her last visit. She has lost 3 lbs since starting treatment with [name].  Vitamin D Deficiency Sister has a diagnosis of vitamin D deficiency. She is currently taking prescription Vit D. She notes fatigue and denies nausea, vomiting or muscle weakness.  At risk for osteopenia and osteoporosis Jessica Yoder is at higher risk of osteopenia and osteoporosis due to vitamin D deficiency.   Pre-Diabetes Jessica Yoder has a diagnosis of pre-diabetes based on her elevated Hgb A1c and was informed this puts her at greater risk of developing diabetes. She denies GI side effects of metformin, notes cravings and continues to work on diet and exercise to decrease risk of diabetes. She denies nausea or hypoglycemia.  ALLERGIES: Allergies  Allergen Reactions  . Amitriptyline Rash  . Latex Rash  . Saxenda [Liraglutide -Weight Management] Itching    Injection site    MEDICATIONS: Current Outpatient Medications on File Prior to Visit  Medication Sig Dispense Refill  . levonorgestrel (MIRENA) 20 MCG/24HR IUD 1 each by Intrauterine route once.    . metFORMIN (GLUCOPHAGE) 500 MG tablet Take 1 tablet (500 mg total) by mouth daily with breakfast. 30 tablet 0   No current facility-administered medications on file prior to visit.     PAST MEDICAL HISTORY: Past Medical History:    Diagnosis Date  . Allergy   . Elevated hemoglobin A1c   . Migraine   . Vitamin D deficiency     PAST SURGICAL HISTORY: Past Surgical History:  Procedure Laterality Date  . EYE SURGERY  2011   Lasik  . LAPAROSCOPIC GASTRIC BANDING  06/28/2008    SOCIAL HISTORY: Social History   Tobacco Use  . Smoking status: Never Smoker  . Smokeless tobacco: Never Used  Substance Use Topics  . Alcohol use: Yes    Comment: socially  . Drug use: No    FAMILY HISTORY: Family History  Problem Relation Age of Onset  . Other Father        ETOH Abuse  . Thyroid disease Mother   . Obesity Mother   . Cancer Other   . Hyperlipidemia Other   . Stroke Other   . Diabetes Other   . Obesity Other   . Sleep apnea Other     ROS: Review of Systems  Constitutional: Positive for malaise/fatigue. Negative for weight loss.  Gastrointestinal: Negative for nausea and vomiting.  Musculoskeletal:       Negative muscle weakness  Endo/Heme/Allergies:       Negative hypoglycemia    PHYSICAL EXAM: Blood pressure 124/78, pulse 79, temperature 98 F (36.7 C), temperature source Oral, height 5\' 6"  (1.676 m), weight (!) 329 lb (149.2 kg), SpO2 96 %. Body mass index is 53.1 kg/m. Physical Exam  Constitutional: She is oriented to person, place, and  time. She appears well-developed and well-nourished.  Cardiovascular: Normal rate.  Pulmonary/Chest: Effort normal.  Musculoskeletal: Normal range of motion.  Neurological: She is oriented to person, place, and time.  Skin: Skin is warm and dry.  Psychiatric: She has a normal mood and affect. Her behavior is normal.  Vitals reviewed.   RECENT LABS AND TESTS: BMET    Component Value Date/Time   NA 142 06/04/2017 1205   K 4.5 06/04/2017 1205   CL 105 06/04/2017 1205   CO2 25 06/04/2017 1205   GLUCOSE 81 06/04/2017 1205   GLUCOSE 105 (H) 12/12/2014 1724   BUN 12 06/04/2017 1205   CREATININE 0.71 06/04/2017 1205   CALCIUM 9.3 06/04/2017 1205    GFRNONAA 105 06/04/2017 1205   GFRAA 121 06/04/2017 1205   Lab Results  Component Value Date   HGBA1C 5.8 (H) 06/04/2017   Lab Results  Component Value Date   INSULIN 28.5 (H) 06/04/2017   CBC    Component Value Date/Time   WBC 5.4 06/04/2017 1205   WBC 8.9 06/29/2008 0430   RBC 4.49 06/04/2017 1205   RBC 4.28 06/29/2008 0430   HGB 13.2 06/04/2017 1205   HCT 40.6 06/04/2017 1205   PLT 332 06/29/2008 0430   MCV 90 06/04/2017 1205   MCH 29.4 06/04/2017 1205   MCHC 32.5 06/04/2017 1205   MCHC 33.9 06/29/2008 0430   RDW 13.2 06/04/2017 1205   LYMPHSABS 2.6 06/04/2017 1205   MONOABS 0.5 06/29/2008 0430   EOSABS 0.2 06/04/2017 1205   BASOSABS 0.0 06/04/2017 1205   Iron/TIBC/Ferritin/ %Sat No results found for: IRON, TIBC, FERRITIN, IRONPCTSAT Lipid Panel     Component Value Date/Time   CHOL 163 06/04/2017 1205   TRIG 63 06/04/2017 1205   HDL 46 06/04/2017 1205   LDLCALC 104 (H) 06/04/2017 1205   Hepatic Function Panel     Component Value Date/Time   PROT 7.0 06/04/2017 1205   ALBUMIN 4.6 06/04/2017 1205   AST 17 06/04/2017 1205   ALT 14 06/04/2017 1205   ALKPHOS 78 06/04/2017 1205   BILITOT <0.2 06/04/2017 1205      Component Value Date/Time   TSH 1.550 06/04/2017 1205  Results for Gildardo PoundsCAPLE, Lahna J (MRN [id number]) as of 07/09/2017 09:16  Ref. Range 06/04/2017 12:05  Vitamin D, 25-Hydroxy Latest Ref Range: 30.0 - 100.0 ng/mL 14.0 (L)    ASSESSMENT AND PLAN: Vitamin D deficiency - Plan: Vitamin D, Ergocalciferol, (DRISDOL) 50000 units CAPS capsule  Prediabetes  At risk for osteoporosis  Class 3 severe obesity with serious comorbidity and body mass index (BMI) of 50.0 to 59.9 in adult, unspecified obesity type (HCC)  PLAN:  Vitamin D Deficiency Jessica Yoder was informed that low vitamin D levels contributes to fatigue and are associated with obesity, breast, and colon cancer. Jessica Yoder agrees to continue taking prescription Vit D @50 ,000 IU every week #4 and we will  refill for 1 month. She will follow up for routine testing of vitamin D, at least 2-3 times per year. She was informed of the risk of over-replacement of vitamin D and agrees to not increase her dose unless she discusses this with us first. Jessica Yoder agrees to follow up with our clinic in 2 weeks.  At risk for osteopenia and osteoporosis Jessica Yoder is at risk for osteopenia and osteoporsis due to her vitamin D deficiency. She was encouraged to take her vitamin D and follow her higher calcium diet and increase strengthening exercise to help strengthen her bones and decrease her  risk of osteopenia and osteoporosis.  Pre-Diabetes Jessica Yoder will continue to work on weight loss, exercise, and decreasing simple carbohydrates in her diet to help decrease the risk of diabetes. We dicussed metformin including benefits and risks. She was informed that eating too many simple carbohydrates or too many calories at one sitting increases the likelihood of GI side effects. Jessica Yoder agrees to continue taking metformin and she agrees to follow up with our clinic in 2 weeks as directed to monitor her progress.  Obesity Jessica Yoder is currently in the action stage of change. As such, her goal is to continue with weight loss efforts She has agreed to keep a food journal with 400-500 calories and 40 grams of protein at lunch daily or 500-650 calories and 45 grams of protein at dinner daily and follow the Category 4 plan Jessica Yoder has been instructed to work up to a goal of 150 minutes of combined cardio and strengthening exercise per week for weight loss and overall health benefits. We discussed the following Behavioral Modification Strategies today: increasing lean protein intake, increasing vegetables, work on meal planning and easy cooking plans, planning for success, and keep a strict food journal   Jessica Yoder has agreed to follow up with our clinic in 2 weeks. She was informed of the importance of frequent follow up visits to maximize her  success with intensive lifestyle modifications for her multiple health conditions.   OBESITY BEHAVIORAL INTERVENTION VISIT  Today's visit was # 3 out of 22.  Starting weight: 332 lbs Starting date: 06/04/17 Today's weight : 329 lbs  Today's date: 07/08/2017 Total lbs lost to date: 3 (Patients must lose 7 lbs in the first 6 months to continue with counseling)   ASK: We discussed the diagnosis of obesity with Jessica Yoder today and Jessica Yoder agreed to give [name] permission to discuss obesity behavioral modification therapy today.  ASSESS: Jessica Yoder has the diagnosis of obesity and her BMI today is 53.13 Jessica Yoder is in the action stage of change   ADVISE: Jessica Yoder was educated on the multiple health risks of obesity as well as the benefit of weight loss to improve her health. She was advised of the need for long term treatment and the importance of lifestyle modifications.  AGREE: Multiple dietary modification options and treatment options were discussed and  Jessica Yoder agreed to the above obesity treatment plan.  I, Burt Knack, am acting as transcriptionist for Debbra Riding, MD  I have reviewed the above documentation for accuracy and completeness, and I agree with the above. - Debbra Riding, MD

## 2017-07-19 ENCOUNTER — Telehealth: Payer: 59 | Admitting: Family

## 2017-07-19 DIAGNOSIS — H938X3 Other specified disorders of ear, bilateral: Secondary | ICD-10-CM

## 2017-07-19 DIAGNOSIS — R0981 Nasal congestion: Secondary | ICD-10-CM

## 2017-07-19 MED ORDER — FLUTICASONE PROPIONATE 50 MCG/ACT NA SUSP: 2.0000 | Freq: Every day | NASAL | 2 refills | Status: DC

## 2017-07-19 MED FILL — FLUTICASONE PROP 50 MCG SPR: 50 | 30 days supply | Qty: 16 | Fill #0

## 2017-07-19 MED FILL — VIT D2 1.25 MG (50,000 UNIT: 1.25 MG | 28 days supply | Qty: 4 | Fill #0

## 2017-07-19 NOTE — Progress Notes (Signed)
Thank you for the details you included in the comment boxes. Those details are very helpful in determining the best course of treatment for you and help us to provide the best care. You indicated you also have a sinus headache, which means you have sinus congestion as well. This sometimes causes fluid to build up in the ears. Given that there is a lot of pollen outside, this could make things worse as well. Often, sinus congestion is caused by a virus or allergies. I am sending flonase below to help open up your sinuses and relieve some of the pressure and congestion. See plan below. This plan is the same for allergies OR a sinus virus with the goal being to reduce any swelling so that your sinuses and ears can feel better. There are no ear drops or antibiotics that would be indicated at this time and you should improve quickly. If you don't improve over the next couple days, please let us know.    E visit for Allergic Rhinitis We are sorry that you are not feeling well.  Her is how we plan to help!  Based on what you have shared with me it looks like you have Allergic Rhinitis.  Rhinitis is when a reaction occurs that causes nasal congestion, runny nose, sneezing, and itching.  Most types of rhinitis are caused by an inflammation and are associated with symptoms in the eyes ears or throat. There are several types of rhinitis.  The most common are acute rhinitis, which is usually caused by a viral illness, allergic or seasonal rhinitis, and nonallergic or year-round rhinitis.  Nasal allergies occur certain times of the year.  Allergic rhinitis is caused when allergens in the air trigger the release of histamine in the body.  Histamine causes itching, swelling, and fluid to build up in the fragile linings of the nasal passages, sinuses and eyelids.  An itchy nose and clear discharge are common.  I recommend the following over the counter treatments: You should take a daily dose of antihistamine and Xyzal 5  mg take 1 tablet daily  I also would recommend a nasal spray: Flonase 2 sprays into each nostril once daily   HOME CARE:   You can use an over-the-counter saline nasal spray as needed  Avoid areas where there is heavy dust, mites, or molds  Stay indoors on windy days during the pollen season  Keep windows closed in home, at least in bedroom; use air conditioner.  Use high-efficiency house air filter  Keep windows closed in car, turn AC on re-circulate  Avoid playing out with dog during pollen season  GET HELP RIGHT AWAY IF:   If your symptoms do not improve within 10 days  You become short of breath  You develop yellow or green discharge from your nose for over 3 days  You have coughing fits  MAKE SURE YOU:   Understand these instructions  Will watch your condition  Will get help right away if you are not doing well or get worse  Thank you for choosing an e-visit. Your e-visit answers were reviewed by a board certified advanced clinical practitioner to complete your personal care plan. Depending upon the condition, your plan could have included both over the counter or prescription medications. Please review your pharmacy choice. Be sure that the pharmacy you have chosen is open so that you can pick up your prescription now.  If there is a problem you may message your provider in MyChart to have  the prescription routed to another pharmacy. Your safety is important to [name]. If you have drug allergies check your prescription carefully.  For the next 24 hours, you can use MyChart to ask questions about today's visit, request a non-urgent call back, or ask for a work or school excuse from your e-visit provider. You will get an email in the next two days asking about your experience. I hope that your e-visit has been valuable and will speed your recovery.

## 2017-07-22 ENCOUNTER — Ambulatory Visit (INDEPENDENT_AMBULATORY_CARE_PROVIDER_SITE_OTHER): Payer: 59 | Admitting: Family Medicine

## 2017-07-22 VITALS — BP 118/76 | HR 84 | Temp 98.9°F | Ht 66.0 in | Wt 331.0 lb

## 2017-07-22 DIAGNOSIS — R7303 Prediabetes: Secondary | ICD-10-CM | POA: Diagnosis not present

## 2017-07-22 DIAGNOSIS — E559 Vitamin D deficiency, unspecified: Secondary | ICD-10-CM

## 2017-07-22 DIAGNOSIS — Z6841 Body Mass Index (BMI) 40.0 and over, adult: Secondary | ICD-10-CM | POA: Diagnosis not present

## 2017-07-22 DIAGNOSIS — Z9189 Other specified personal risk factors, not elsewhere classified: Secondary | ICD-10-CM

## 2017-07-22 MED ORDER — METFORMIN HCL 500 MG PO TABS: 500.0000 mg | ORAL_TABLET | Freq: Every day | ORAL | 0 refills | Status: DC

## 2017-07-22 MED FILL — metFORMIN HCL 500 MG TABS: 500 | 30 days supply | Qty: 30 | Fill #0

## 2017-07-22 NOTE — Progress Notes (Signed)
Office: [phone]  /  Fax: [phone]   HPI:   Chief Complaint: OBESITY Jessica Yoder is here to discuss her progress with her obesity treatment plan. She is on the keep a food journal with 400 to 500 calories and 40 grams of protein at lunch daily or 500 to 650 calories and 45 grams of protein at dinner daily and the Category 3 plan and is following her eating plan approximately 70 % of the time. She states she is walking for 30 minutes 3 times per week. Jessica Yoder did journaling the first week and had struggles with getting the protein quantity and staying with calories. She went back to structure on week two. Her weight is (!) 331 lb (150.1 kg) today and has had a weight gain of 2 pounds over a period of 2 weeks since her last visit. She has lost 1 lb since starting treatment with us.  Pre-Diabetes Jessica Yoder has a diagnosis of prediabetes based on her elevated Hgb A1c and was informed this puts her at greater risk of developing diabetes. She denies GI side effects of metformin. Jessica Yoder continues to work on diet and exercise to decrease risk of diabetes. She denies carb cravings, nausea or hypoglycemia.  At risk for diabetes Jessica Yoder is at higher than average risk for developing diabetes due to her obesity and pre-diabetes. She currently denies polyuria or polydipsia.  Vitamin D deficiency Jessica Yoder has a diagnosis of vitamin D deficiency. She is currently taking vit D. Jessica Yoder admits fatigue and denies nausea, vomiting or muscle weakness.  ALLERGIES: Allergies  Allergen Reactions  . Amitriptyline Rash  . Latex Rash  . Saxenda [Liraglutide -Weight Management] Itching    Injection site    MEDICATIONS: Current Outpatient Medications on File Prior to Visit  Medication Sig Dispense Refill  . fluticasone (FLONASE) 50 MCG/ACT nasal spray Place 2 sprays into both nostrils daily. 16 g 2  . levonorgestrel (MIRENA) 20 MCG/24HR IUD 1 each by Intrauterine route once.    . Vitamin D, Ergocalciferol,  (DRISDOL) 50000 units CAPS capsule Take 1 capsule (50,000 Units total) by mouth every 7 (seven) days. 4 capsule 0   No current facility-administered medications on file prior to visit.     PAST MEDICAL HISTORY: Past Medical History:  Diagnosis Date  . Allergy   . Elevated hemoglobin A1c   . Migraine   . Vitamin D deficiency     PAST SURGICAL HISTORY: Past Surgical History:  Procedure Laterality Date  . EYE SURGERY  2011   Lasik  . LAPAROSCOPIC GASTRIC BANDING  06/28/2008    SOCIAL HISTORY: Social History   Tobacco Use  . Smoking status: Never Smoker  . Smokeless tobacco: Never Used  Substance Use Topics  . Alcohol use: Yes    Comment: socially  . Drug use: No    FAMILY HISTORY: Family History  Problem Relation Age of Onset  . Other Father        ETOH Abuse  . Thyroid disease Mother   . Obesity Mother   . Cancer Other   . Hyperlipidemia Other   . Stroke Other   . Diabetes Other   . Obesity Other   . Sleep apnea Other     ROS: Review of Systems  Constitutional: Positive for malaise/fatigue. Negative for weight loss.  Gastrointestinal: Negative for diarrhea, nausea and vomiting.  Genitourinary: Negative for frequency.  Musculoskeletal:       Negative for muscle weakness  Endo/Heme/Allergies: Negative for polydipsia.  Negative for carb cravings Negative for hypoglycemia    PHYSICAL EXAM: Blood pressure 118/76, pulse 84, temperature 98.9 F (37.2 C), temperature source Oral, height 5\' 6"  (1.676 m), weight (!) 331 lb (150.1 kg), SpO2 98 %. Body mass index is 53.42 kg/m. Physical Exam  Constitutional: She is oriented to person, place, and time. She appears well-developed and well-nourished.  Cardiovascular: Normal rate.  Pulmonary/Chest: Effort normal.  Musculoskeletal: Normal range of motion.  Neurological: She is oriented to person, place, and time.  Skin: Skin is warm and dry.  Psychiatric: She has a normal mood and affect. Her behavior is  normal.  Vitals reviewed.   RECENT LABS AND TESTS: BMET    Component Value Date/Time   NA 142 06/04/2017 1205   K 4.5 06/04/2017 1205   CL 105 06/04/2017 1205   CO2 25 06/04/2017 1205   GLUCOSE 81 06/04/2017 1205   GLUCOSE 105 (H) 12/12/2014 1724   BUN 12 06/04/2017 1205   CREATININE 0.71 06/04/2017 1205   CALCIUM 9.3 06/04/2017 1205   GFRNONAA 105 06/04/2017 1205   GFRAA 121 06/04/2017 1205   Lab Results  Component Value Date   HGBA1C 5.8 (H) 06/04/2017   Lab Results  Component Value Date   INSULIN 28.5 (H) 06/04/2017   CBC    Component Value Date/Time   WBC 5.4 06/04/2017 1205   WBC 8.9 06/29/2008 0430   RBC 4.49 06/04/2017 1205   RBC 4.28 06/29/2008 0430   HGB 13.2 06/04/2017 1205   HCT 40.6 06/04/2017 1205   PLT 332 06/29/2008 0430   MCV 90 06/04/2017 1205   MCH 29.4 06/04/2017 1205   MCHC 32.5 06/04/2017 1205   MCHC 33.9 06/29/2008 0430   RDW 13.2 06/04/2017 1205   LYMPHSABS 2.6 06/04/2017 1205   MONOABS 0.5 06/29/2008 0430   EOSABS 0.2 06/04/2017 1205   BASOSABS 0.0 06/04/2017 1205   Iron/TIBC/Ferritin/ %Sat No results found for: IRON, TIBC, FERRITIN, IRONPCTSAT Lipid Panel     Component Value Date/Time   CHOL 163 06/04/2017 1205   TRIG 63 06/04/2017 1205   HDL 46 06/04/2017 1205   LDLCALC 104 (H) 06/04/2017 1205   Hepatic Function Panel     Component Value Date/Time   PROT 7.0 06/04/2017 1205   ALBUMIN 4.6 06/04/2017 1205   AST 17 06/04/2017 1205   ALT 14 06/04/2017 1205   ALKPHOS 78 06/04/2017 1205   BILITOT <0.2 06/04/2017 1205      Component Value Date/Time   TSH 1.550 06/04/2017 1205   Results for ANALEYA, LUALLEN (MRN [id number]) as of 07/22/2017 17:43  Ref. Range 06/04/2017 12:05  Vitamin D, 25-Hydroxy Latest Ref Range: 30.0 - 100.0 ng/mL 14.0 (L)   ASSESSMENT AND PLAN: Prediabetes - Plan: metFORMIN (GLUCOPHAGE) 500 MG tablet  Vitamin D deficiency  At risk for diabetes mellitus  Class 3 severe obesity with serious comorbidity  and body mass index (BMI) of 50.0 to 59.9 in adult, unspecified obesity type [facility])  PLAN:  Pre-Diabetes Zaria will continue to work on weight loss, exercise, and decreasing simple carbohydrates in her diet to help decrease the risk of diabetes. We dicussed metformin including benefits and risks. She was informed that eating too many simple carbohydrates or too many calories at one sitting increases the likelihood of GI side effects. Natally requested metformin for now and a prescription was written today for 1 month refill. Brunella agreed to follow up with [name] as directed to monitor her progress.  Diabetes risk counseling Lacole was  given extended (15 minutes) diabetes prevention counseling today. She is 42 y.o. female and has risk factors for diabetes including obesity and pre-diabetes. We discussed intensive lifestyle modifications today with an emphasis on weight loss as well as increasing exercise and decreasing simple carbohydrates in her diet.  Vitamin D Deficiency Kynli was informed that low vitamin D levels contributes to fatigue and are associated with obesity, breast, and colon cancer. She agrees to continue to take prescription Vit D @50 ,000 IU every week (no refill needed) and will follow up for routine testing of vitamin D, at least 2-3 times per year. She was informed of the risk of over-replacement of vitamin D and agrees to not increase her dose unless she discusses this with [name] first.  Obesity Chalyn is currently in the action stage of change. As such, her goal is to continue with weight loss efforts She has agreed to follow the Category 4 plan Vega has been instructed to work up to a goal of 150 minutes of combined cardio and strengthening exercise per week for weight loss and overall health benefits. We discussed the following Behavioral Modification Strategies today: planing for success, increasing lean protein intake, increasing vegetables and work on meal planning and easy  cooking plans  Shonice has agreed to follow up with our clinic in 2 weeks. She was informed of the importance of frequent follow up visits to maximize her success with intensive lifestyle modifications for her multiple health conditions.   OBESITY BEHAVIORAL INTERVENTION VISIT  Today's visit was # 4 out of 22.  Starting weight: 332 lbs Starting date: 06/04/17 Today's weight : 331 lbs  Today's date: 07/22/2017 Total lbs lost to date: 1 (Patients must lose 7 lbs in the first 6 months to continue with counseling)   ASK: We discussed the diagnosis of obesity with Hardie Shackleton Loughney today and Ailee agreed to give [name] permission to discuss obesity behavioral modification therapy today.  ASSESS: Amberia has the diagnosis of obesity and her BMI today is 53.45 Cali is in the action stage of change   ADVISE: Conswella was educated on the multiple health risks of obesity as well as the benefit of weight loss to improve her health. She was advised of the need for long term treatment and the importance of lifestyle modifications.  AGREE: Multiple dietary modification options and treatment options were discussed and  Jaslynn agreed to the above obesity treatment plan.  I, [place] Crane, am acting as transcriptionist for Filbert Schilder, MD  I have reviewed the above documentation for accuracy and completeness, and I agree with the above. - Debbra Riding, MD

## 2017-07-23 ENCOUNTER — Telehealth: Payer: 59 | Admitting: Physician Assistant

## 2017-07-23 DIAGNOSIS — J019 Acute sinusitis, unspecified: Secondary | ICD-10-CM | POA: Diagnosis not present

## 2017-07-23 DIAGNOSIS — B9689 Other specified bacterial agents as the cause of diseases classified elsewhere: Secondary | ICD-10-CM | POA: Diagnosis not present

## 2017-07-23 MED ORDER — DOXYCYCLINE HYCLATE 100 MG PO CAPS: 100.0000 mg | ORAL_CAPSULE | Freq: Two times a day (BID) | ORAL | 0 refills | Status: AC

## 2017-07-23 MED FILL — DOXYCYCLINE HYCLATE 100 MG: 100 | 10 days supply | Qty: 20 | Fill #0

## 2017-07-23 NOTE — Progress Notes (Signed)

## 2017-07-26 MED ORDER — PREDNISONE 5 MG PO TABS: 5.0000 mg | ORAL_TABLET | ORAL | 0 refills | Status: DC

## 2017-07-26 MED FILL — predniSONE 5 MG TABS: 5 | 6 days supply | Qty: 21 | Fill #0

## 2017-07-26 NOTE — Addendum Note (Signed)
Addended by: Beau FannyWITHROW, Coye Dawood C on: 07/26/2017 10:10 AM   Modules accepted: Orders

## 2017-08-09 ENCOUNTER — Encounter (INDEPENDENT_AMBULATORY_CARE_PROVIDER_SITE_OTHER): Payer: Self-pay | Admitting: Family Medicine

## 2017-08-12 ENCOUNTER — Encounter (INDEPENDENT_AMBULATORY_CARE_PROVIDER_SITE_OTHER): Payer: Self-pay

## 2017-08-12 ENCOUNTER — Ambulatory Visit (INDEPENDENT_AMBULATORY_CARE_PROVIDER_SITE_OTHER): Payer: 59 | Admitting: Family Medicine

## 2017-09-09 ENCOUNTER — Ambulatory Visit (INDEPENDENT_AMBULATORY_CARE_PROVIDER_SITE_OTHER): Payer: 59 | Admitting: Family Medicine

## 2017-09-09 VITALS — BP 117/74 | HR 79 | Temp 98.3°F | Ht 66.0 in | Wt 337.0 lb

## 2017-09-09 DIAGNOSIS — E559 Vitamin D deficiency, unspecified: Secondary | ICD-10-CM

## 2017-09-09 DIAGNOSIS — Z6841 Body Mass Index (BMI) 40.0 and over, adult: Secondary | ICD-10-CM | POA: Diagnosis not present

## 2017-09-09 DIAGNOSIS — Z9189 Other specified personal risk factors, not elsewhere classified: Secondary | ICD-10-CM

## 2017-09-09 DIAGNOSIS — R7303 Prediabetes: Secondary | ICD-10-CM | POA: Diagnosis not present

## 2017-09-09 MED ORDER — VITAMIN D (ERGOCALCIFEROL) 1.25 MG (50000 UNIT) PO CAPS: 50000.0000 [IU] | ORAL_CAPSULE | ORAL | 0 refills | Status: DC

## 2017-09-09 MED ORDER — METFORMIN HCL 500 MG PO TABS: 500.0000 mg | ORAL_TABLET | Freq: Every day | ORAL | 0 refills | Status: DC

## 2017-09-09 MED FILL — VIT D2 1.25 MG (50,000 UNIT: 1.25 MG | 28 days supply | Qty: 4 | Fill #0

## 2017-09-09 MED FILL — metFORMIN HCL 500 MG TABS: 500 | 30 days supply | Qty: 30 | Fill #0

## 2017-09-09 NOTE — Progress Notes (Signed)
Office: [phone]  /  Fax: [phone]   HPI:   Chief Complaint: OBESITY Naria is here to discuss her progress with her obesity treatment plan. She is on the Category 4 plan and is following her eating plan approximately 15 % of the time. She states she is walking for 30 minutes 3-4 times per week. Gwendlyon had lots of things in the past four weeks. Trips to [place], death in family, and trip to [place]. She found she resorted back to old eating habits.  Her weight is (!) 337 lb (152.9 kg) today and has gained 6 pounds since her last visit. She has lost 0 lbs since starting treatment with [name].  Vitamin D Deficiency Troyce has a diagnosis of vitamin D deficiency. She is currently taking prescription Vit D. She notes fatigue and denies nausea, vomiting or muscle weakness.  Pre-Diabetes Shantinique has a diagnosis of pre-diabetes based on her elevated Hgb A1c and was informed this puts her at greater risk of developing diabetes. She notes carbohydrates cravings. She is taking metformin currently and continues to work on diet and exercise to decrease risk of diabetes. She denies nausea or hypoglycemia.  At risk for diabetes Nyjae is at higher than average risk for developing diabetes due to her obesity and pre-diabetes. She currently denies polyuria or polydipsia.  ALLERGIES: Allergies  Allergen Reactions  . Amitriptyline Rash  . Latex Rash  . Saxenda [Liraglutide -Weight Management] Itching    Injection site    MEDICATIONS: Current Outpatient Medications on File Prior to Visit  Medication Sig Dispense Refill  . fluticasone (FLONASE) 50 MCG/ACT nasal spray Place 2 sprays into both nostrils daily. 16 g 2  . levonorgestrel (MIRENA) 20 MCG/24HR IUD 1 each by Intrauterine route once.    . metFORMIN (GLUCOPHAGE) 500 MG tablet Take 1 tablet (500 mg total) by mouth daily with breakfast. 30 tablet 0  . predniSONE (DELTASONE) 5 MG tablet Take 1 tablet (5 mg total) by mouth as  directed. Taper 6,5,4,3,2,1 21 tablet 0  . Vitamin D, Ergocalciferol, (DRISDOL) 50000 units CAPS capsule Take 1 capsule (50,000 Units total) by mouth every 7 (seven) days. 4 capsule 0   No current facility-administered medications on file prior to visit.     PAST MEDICAL HISTORY: Past Medical History:  Diagnosis Date  . Allergy   . Elevated hemoglobin A1c   . Migraine   . Vitamin D deficiency     PAST SURGICAL HISTORY: Past Surgical History:  Procedure Laterality Date  . EYE SURGERY  2011   Lasik  . LAPAROSCOPIC GASTRIC BANDING  06/28/2008    SOCIAL HISTORY: Social History   Tobacco Use  . Smoking status: Never Smoker  . Smokeless tobacco: Never Used  Substance Use Topics  . Alcohol use: Yes    Comment: socially  . Drug use: No    FAMILY HISTORY: Family History  Problem Relation Age of Onset  . Other Father        ETOH Abuse  . Thyroid disease Mother   . Obesity Mother   . Cancer Other   . Hyperlipidemia Other   . Stroke Other   . Diabetes Other   . Obesity Other   . Sleep apnea Other     ROS: Review of Systems  Constitutional: Positive for malaise/fatigue. Negative for weight loss.  Gastrointestinal: Negative for nausea and vomiting.  Genitourinary: Negative for frequency.  Musculoskeletal:       Negative muscle weakness  Endo/Heme/Allergies: Negative for polydipsia.  Negative hypoglycemia    PHYSICAL EXAM: Blood pressure 117/74, pulse 79, temperature 98.3 F (36.8 C), temperature source Oral, height 5\' 6"  (1.676 m), weight (!) 337 lb (152.9 kg), SpO2 98 %. Body mass index is 54.39 kg/m. Physical Exam  Constitutional: She is oriented to person, place, and time. She appears well-developed and well-nourished.  Cardiovascular: Normal rate.  Pulmonary/Chest: Effort normal.  Musculoskeletal: Normal range of motion.  Neurological: She is oriented to person, place, and time.  Skin: Skin is warm and dry.  Psychiatric: She has a normal mood and  affect. Her behavior is normal.  Vitals reviewed.   RECENT LABS AND TESTS: BMET    Component Value Date/Time   NA 142 06/04/2017 1205   K 4.5 06/04/2017 1205   CL 105 06/04/2017 1205   CO2 25 06/04/2017 1205   GLUCOSE 81 06/04/2017 1205   GLUCOSE 105 (H) 12/12/2014 1724   BUN 12 06/04/2017 1205   CREATININE 0.71 06/04/2017 1205   CALCIUM 9.3 06/04/2017 1205   GFRNONAA 105 06/04/2017 1205   GFRAA 121 06/04/2017 1205   Lab Results  Component Value Date   HGBA1C 5.8 (H) 06/04/2017   Lab Results  Component Value Date   INSULIN 28.5 (H) 06/04/2017   CBC    Component Value Date/Time   WBC 5.4 06/04/2017 1205   WBC 8.9 06/29/2008 0430   RBC 4.49 06/04/2017 1205   RBC 4.28 06/29/2008 0430   HGB 13.2 06/04/2017 1205   HCT 40.6 06/04/2017 1205   PLT 332 06/29/2008 0430   MCV 90 06/04/2017 1205   MCH 29.4 06/04/2017 1205   MCHC 32.5 06/04/2017 1205   MCHC 33.9 06/29/2008 0430   RDW 13.2 06/04/2017 1205   LYMPHSABS 2.6 06/04/2017 1205   MONOABS 0.5 06/29/2008 0430   EOSABS 0.2 06/04/2017 1205   BASOSABS 0.0 06/04/2017 1205   Iron/TIBC/Ferritin/ %Sat No results found for: IRON, TIBC, FERRITIN, IRONPCTSAT Lipid Panel     Component Value Date/Time   CHOL 163 06/04/2017 1205   TRIG 63 06/04/2017 1205   HDL 46 06/04/2017 1205   LDLCALC 104 (H) 06/04/2017 1205   Hepatic Function Panel     Component Value Date/Time   PROT 7.0 06/04/2017 1205   ALBUMIN 4.6 06/04/2017 1205   AST 17 06/04/2017 1205   ALT 14 06/04/2017 1205   ALKPHOS 78 06/04/2017 1205   BILITOT <0.2 06/04/2017 1205      Component Value Date/Time   TSH 1.550 06/04/2017 1205  Results for Gildardo PoundsCAPLE, Avanna J (MRN [id number]) as of 09/09/2017 14:21  Ref. Range 06/04/2017 12:05  Vitamin D, 25-Hydroxy Latest Ref Range: 30.0 - 100.0 ng/mL 14.0 (L)    ASSESSMENT AND PLAN: Vitamin D deficiency - Plan: Vitamin D, Ergocalciferol, (DRISDOL) 50000 units CAPS capsule  Prediabetes - Plan: metFORMIN (GLUCOPHAGE) 500  MG tablet  At risk for diabetes mellitus  Class 3 severe obesity with serious comorbidity and body mass index (BMI) of 50.0 to 59.9 in adult, unspecified obesity type (HCC)  PLAN:  Vitamin D Deficiency [name] was informed that low vitamin D levels contributes to fatigue and are associated with obesity, breast, and colon cancer. [name] agrees to continue taking prescription Vit D @50 ,000 IU every week #4 and we will refill for 1 month. She will follow up for routine testing of vitamin D, at least 2-3 times per year. She was informed of the risk of over-replacement of vitamin D and agrees to not increase her dose unless she discusses this with us  first. [name] agrees to follow up with our clinic in 2 weeks.  Pre-Diabetes [name] will continue to work on weight loss, exercise, and decreasing simple carbohydrates in her diet to help decrease the risk of diabetes. We dicussed metformin including benefits and risks. She was informed that eating too many simple carbohydrates or too many calories at one sitting increases the likelihood of GI side effects. [name] agrees to continue taking metformin 500 mg q AM #30 and we will refill for 1 month. [name] agrees to follow up with our clinic in 2 weeks as directed to monitor her progress.  Diabetes risk counselling [name] was given extended (15 minutes) diabetes prevention counseling today. She is 42 y.o. female and has risk factors for diabetes including obesity and pre-diabetes. We discussed intensive lifestyle modifications today with an emphasis on weight loss as well as increasing exercise and decreasing simple carbohydrates in her diet.  Obesity [name] is currently in the action stage of change. As such, her goal is to continue with weight loss efforts She has agreed to follow the Category 4 plan [name] has been instructed to work up to a goal of 150 minutes of combined cardio and strengthening exercise per week for weight loss and overall health  benefits. We discussed the following Behavioral Modification Strategies today: increasing lean protein intake, increasing vegetables, work on meal planning and easy cooking plans, better snacking choices, and planning for success   [name] has agreed to follow up with our clinic in 2 weeks. She was informed of the importance of frequent follow up visits to maximize her success with intensive lifestyle modifications for her multiple health conditions.   OBESITY BEHAVIORAL INTERVENTION VISIT  Today's visit was # 5 out of 22.  Starting weight: 332 lbs Starting date: 06/04/17 Today's weight : 337 lbs Today's date: 09/09/2017 Total lbs lost to date: 0    ASK: We discussed the diagnosis of obesity with Hardie Shackletonamara J Leugers today and [name] agreed to give us permission to discuss obesity behavioral modification therapy today.  ASSESS: [name] has the diagnosis of obesity and her BMI today is 54.42 [name] is in the action stage of change   ADVISE: [name] was educated on the multiple health risks of obesity as well as the benefit of weight loss to improve her health. She was advised of the need for long term treatment and the importance of lifestyle modifications.  AGREE: Multiple dietary modification options and treatment options were discussed and  [name] agreed to the above obesity treatment plan.  I, Burt KnackSharon Martin, am acting as transcriptionist for Debbra RidingAlexandria Kadolph, MD  I have reviewed the above documentation for accuracy and completeness, and I agree with the above. - Debbra RidingAlexandria Kadolph, MD

## 2017-09-25 ENCOUNTER — Encounter (INDEPENDENT_AMBULATORY_CARE_PROVIDER_SITE_OTHER): Payer: Self-pay

## 2017-09-25 ENCOUNTER — Ambulatory Visit (INDEPENDENT_AMBULATORY_CARE_PROVIDER_SITE_OTHER): Payer: 59 | Admitting: Family Medicine

## 2017-10-03 ENCOUNTER — Ambulatory Visit (INDEPENDENT_AMBULATORY_CARE_PROVIDER_SITE_OTHER): Payer: 59 | Admitting: Bariatrics

## 2017-10-03 VITALS — BP 110/72 | HR 85 | Temp 97.8°F | Ht 66.0 in | Wt 336.0 lb

## 2017-10-03 DIAGNOSIS — Z6841 Body Mass Index (BMI) 40.0 and over, adult: Secondary | ICD-10-CM | POA: Diagnosis not present

## 2017-10-03 DIAGNOSIS — E559 Vitamin D deficiency, unspecified: Secondary | ICD-10-CM

## 2017-10-03 DIAGNOSIS — R7303 Prediabetes: Secondary | ICD-10-CM

## 2017-10-03 DIAGNOSIS — Z9189 Other specified personal risk factors, not elsewhere classified: Secondary | ICD-10-CM

## 2017-10-03 MED ORDER — VITAMIN D (ERGOCALCIFEROL) 1.25 MG (50000 UNIT) PO CAPS: 50000.0000 [IU] | ORAL_CAPSULE | ORAL | 0 refills | Status: DC

## 2017-10-03 MED FILL — VIT D2 1.25 MG (50,000 UNIT: 1.25 MG | 28 days supply | Qty: 4 | Fill #0

## 2017-10-08 ENCOUNTER — Encounter (INDEPENDENT_AMBULATORY_CARE_PROVIDER_SITE_OTHER): Payer: Self-pay | Admitting: Bariatrics

## 2017-10-08 DIAGNOSIS — R7303 Prediabetes: Secondary | ICD-10-CM | POA: Insufficient documentation

## 2017-10-08 DIAGNOSIS — E559 Vitamin D deficiency, unspecified: Secondary | ICD-10-CM | POA: Insufficient documentation

## 2017-10-08 NOTE — Progress Notes (Signed)
Office: [phone]  /  Fax: [phone]   HPI:   Chief Complaint: OBESITY Jessica Yoder is here to discuss her progress with her obesity treatment plan. She is on the Category 4 plan and is following her eating plan approximately 60 % of the time. She states she is walking for 30 minutes 3-4 times per week. Jessica Yoder states she had to get all food in and eating quickly at dinner due to busy schedule. She has been grocery shopping and she denies significant cravings or hunger.  Her weight is (!) 336 lb (152.4 kg) today and has had a weight loss of 1 pound over a period of 3 to 4 weeks since her last visit. She has lost 0 lbs since starting treatment with [name].  Vitamin D Deficiency Jessica Yoder has a diagnosis of vitamin D deficiency. She is currently taking high dose prescription Vit D with no side effects. She denies nausea, vomiting or muscle weakness.  At risk for osteopenia and osteoporosis Jessica Yoder is at higher risk of osteopenia and osteoporosis due to vitamin D deficiency.   Pre-Diabetes Jessica Yoder has a diagnosis of pre-diabetes based on her elevated Hgb A1c and was informed this puts her at greater risk of developing diabetes. She is taking metformin with no side effects, and she denies polyuria or polydipsia. Last Hgb A1c was 5.8 and insulin was 28.5 on 06/04/17. She notes occasional episodes of hypoglycemia. She continues to work on diet and exercise to decrease risk of diabetes.   ALLERGIES: Allergies  Allergen Reactions  . Amitriptyline Rash  . Latex Rash  . Saxenda [Liraglutide -Weight Management] Itching    Injection site    MEDICATIONS: Current Outpatient Medications on File Prior to Visit  Medication Sig Dispense Refill  . fluticasone (FLONASE) 50 MCG/ACT nasal spray Place 2 sprays into both nostrils daily. 16 g 2  . levonorgestrel (MIRENA) 20 MCG/24HR IUD 1 each by Intrauterine route once.    . metFORMIN (GLUCOPHAGE) 500 MG tablet Take 1 tablet (500 mg total) by mouth daily with  breakfast. 30 tablet 0  . predniSONE (DELTASONE) 5 MG tablet Take 1 tablet (5 mg total) by mouth as directed. Taper 6,5,4,3,2,1 21 tablet 0   No current facility-administered medications on file prior to visit.     PAST MEDICAL HISTORY: Past Medical History:  Diagnosis Date  . Allergy   . Elevated hemoglobin A1c   . Migraine   . Vitamin D deficiency     PAST SURGICAL HISTORY: Past Surgical History:  Procedure Laterality Date  . EYE SURGERY  2011   Lasik  . LAPAROSCOPIC GASTRIC BANDING  06/28/2008    SOCIAL HISTORY: Social History   Tobacco Use  . Smoking status: Never Smoker  . Smokeless tobacco: Never Used  Substance Use Topics  . Alcohol use: Yes    Comment: socially  . Drug use: No    FAMILY HISTORY: Family History  Problem Relation Age of Onset  . Other Father        ETOH Abuse  . Thyroid disease Mother   . Obesity Mother   . Cancer Other   . Hyperlipidemia Other   . Stroke Other   . Diabetes Other   . Obesity Other   . Sleep apnea Other     ROS: Review of Systems  Constitutional: Positive for weight loss.  Gastrointestinal: Negative for nausea and vomiting.  Genitourinary: Negative for frequency.  Musculoskeletal:       Negative muscle weakness  Endo/Heme/Allergies: Negative for polydipsia.  Positive hypoglycemia    PHYSICAL EXAM: Blood pressure 110/72, pulse 85, temperature 97.8 F (36.6 C), temperature source Other (Comment), height 5\' 6"  (1.676 m), weight (!) 336 lb (152.4 kg). Body mass index is 54.23 kg/m. Physical Exam  Constitutional: She is oriented to person, place, and time. She appears well-developed and well-nourished.  Cardiovascular: Normal rate.  Pulmonary/Chest: Effort normal.  Musculoskeletal: Normal range of motion.  Neurological: She is oriented to person, place, and time.  Skin: Skin is warm and dry.  Psychiatric: She has a normal mood and affect. Her behavior is normal.  Vitals reviewed.   RECENT LABS AND  TESTS: BMET    Component Value Date/Time   NA 142 06/04/2017 1205   K 4.5 06/04/2017 1205   CL 105 06/04/2017 1205   CO2 25 06/04/2017 1205   GLUCOSE 81 06/04/2017 1205   GLUCOSE 105 (H) 12/12/2014 1724   BUN 12 06/04/2017 1205   CREATININE 0.71 06/04/2017 1205   CALCIUM 9.3 06/04/2017 1205   GFRNONAA 105 06/04/2017 1205   GFRAA 121 06/04/2017 1205   Lab Results  Component Value Date   HGBA1C 5.8 (H) 06/04/2017   Lab Results  Component Value Date   INSULIN 28.5 (H) 06/04/2017   CBC    Component Value Date/Time   WBC 5.4 06/04/2017 1205   WBC 8.9 06/29/2008 0430   RBC 4.49 06/04/2017 1205   RBC 4.28 06/29/2008 0430   HGB 13.2 06/04/2017 1205   HCT 40.6 06/04/2017 1205   PLT 332 06/29/2008 0430   MCV 90 06/04/2017 1205   MCH 29.4 06/04/2017 1205   MCHC 32.5 06/04/2017 1205   MCHC 33.9 06/29/2008 0430   RDW 13.2 06/04/2017 1205   LYMPHSABS 2.6 06/04/2017 1205   MONOABS 0.5 06/29/2008 0430   EOSABS 0.2 06/04/2017 1205   BASOSABS 0.0 06/04/2017 1205   Iron/TIBC/Ferritin/ %Sat No results found for: IRON, TIBC, FERRITIN, IRONPCTSAT Lipid Panel     Component Value Date/Time   CHOL 163 06/04/2017 1205   TRIG 63 06/04/2017 1205   HDL 46 06/04/2017 1205   LDLCALC 104 (H) 06/04/2017 1205   Hepatic Function Panel     Component Value Date/Time   PROT 7.0 06/04/2017 1205   ALBUMIN 4.6 06/04/2017 1205   AST 17 06/04/2017 1205   ALT 14 06/04/2017 1205   ALKPHOS 78 06/04/2017 1205   BILITOT <0.2 06/04/2017 1205      Component Value Date/Time   TSH 1.550 06/04/2017 1205  Results for Gildardo PoundsCAPLE, Cuma J (MRN [id number]) as of 10/08/2017 12:27  Ref. Range 06/04/2017 12:05  Vitamin D, 25-Hydroxy Latest Ref Range: 30.0 - 100.0 ng/mL 14.0 (L)    ASSESSMENT AND PLAN: Vitamin D deficiency - Plan: Vitamin D, Ergocalciferol, (DRISDOL) 50000 units CAPS capsule  Prediabetes  At risk for osteoporosis  Class 3 severe obesity with serious comorbidity and body mass index (BMI)  of 50.0 to 59.9 in adult, unspecified obesity type (HCC)  PLAN:  Vitamin D Deficiency Jessica Yoder was informed that low vitamin D levels contributes to fatigue and are associated with obesity, breast, and colon cancer. Jessica Yoder agrees to continue taking prescription Vit D @50 ,000 IU every week and #4 and we will refill for 1 month, and she will continue Vit D rich foods. She will follow up for routine testing of vitamin D, at least 2-3 times per year. She was informed of the risk of over-replacement of vitamin D and agrees to not increase her dose unless she discusses this with us first.  Jessica Yoder agrees to follow up with our clinic in 2 weeks.  At risk for osteopenia and osteoporosis Jessica Yoder was given extended  (15 minutes) osteoporosis prevention counseling today. Jessica Yoder is at risk for osteopenia and osteoporsis due to her vitamin D deficiency. She was encouraged to take her vitamin D and follow her higher calcium diet and increase strengthening exercise to help strengthen her bones and decrease her risk of osteopenia and osteoporosis.  Pre-Diabetes Jessica Yoder will continue to work on weight loss, exercise, and decreasing simple carbohydrates in her diet to help decrease the risk of diabetes. We dicussed metformin including benefits and risks. She was informed that eating too many simple carbohydrates or too many calories at one sitting increases the likelihood of GI side effects. Jessica Yoder agrees to continue taking metformin, and she was advised not to go long periods of time without eating. Jessica Yoder agrees to follow up with our clinic in 2 weeks as directed to monitor her progress.  Obesity Jessica Yoder is currently in the action stage of change. As such, her goal is to continue with weight loss efforts She has agreed to follow the keep a food journal with 500-600 calories and 40 grams of protein at supper daily and follow the Category 4 plan Jessica Yoder has been instructed to work up to a goal of 150 minutes of combined  cardio and strengthening exercise per week or swimming for 30-45 minutes 2 days per weeks for weight loss and overall health benefits. We discussed the following Behavioral Modification Strategies today: increasing lean protein intake, decreasing simple carbohydrates, increasing vegetables, work on meal planning and easy cooking plans, increase H20 intake, no skipping meals, and keeping healthy food in the home Jessica Yoder will continue Category 4, do journaling at dinner, and meet protein and calorie goals  Jessica Yoder has agreed to follow up with our clinic in 2 weeks. She was informed of the importance of frequent follow up visits to maximize her success with intensive lifestyle modifications for her multiple health conditions.   OBESITY BEHAVIORAL INTERVENTION VISIT  Today's visit was # 6   Starting weight: 332 lbs Starting date: 06/04/17 Today's weight : 336 lbs  Today's date: 10/03/2017 Total lbs lost to date: 0    ASK: We discussed the diagnosis of obesity with Jessica Yoder today and Jessica Yoder agreed to give [name] permission to discuss obesity behavioral modification therapy today.  ASSESS: Jessica Yoder has the diagnosis of obesity and her BMI today is 54.26 Jessica Yoder is in the action stage of change   ADVISE: Jessica Yoder was educated on the multiple health risks of obesity as well as the benefit of weight loss to improve her health. She was advised of the need for long term treatment and the importance of lifestyle modifications to improve her current health and to decrease her risk of future health problems.  AGREE: Multiple dietary modification options and treatment options were discussed and  Jessica Yoder agreed to follow the recommendations documented in the above note.  ARRANGE: Jessica Yoder was educated on the importance of frequent visits to treat obesity as outlined per CMS and USPSTF guidelines and agreed to schedule her next follow up appointment today.  Jessica Yoder Mcburney, am acting as transcriptionist for  [employer], DO  I have reviewed the above documentation for accuracy and completeness, and I agree with the above. -Corinna Capra, DO

## 2017-10-17 ENCOUNTER — Ambulatory Visit (INDEPENDENT_AMBULATORY_CARE_PROVIDER_SITE_OTHER): Payer: 59 | Admitting: Family Medicine

## 2017-10-17 ENCOUNTER — Encounter (INDEPENDENT_AMBULATORY_CARE_PROVIDER_SITE_OTHER): Payer: Self-pay

## 2017-11-12 DIAGNOSIS — Z01419 Encounter for gynecological examination (general) (routine) without abnormal findings: Secondary | ICD-10-CM | POA: Diagnosis not present

## 2017-11-12 DIAGNOSIS — Z1231 Encounter for screening mammogram for malignant neoplasm of breast: Secondary | ICD-10-CM | POA: Diagnosis not present

## 2017-11-12 DIAGNOSIS — Z6841 Body Mass Index (BMI) 40.0 and over, adult: Secondary | ICD-10-CM | POA: Diagnosis not present

## 2017-11-18 DIAGNOSIS — R7303 Prediabetes: Secondary | ICD-10-CM | POA: Diagnosis not present

## 2017-11-18 DIAGNOSIS — Z0100 Encounter for examination of eyes and vision without abnormal findings: Secondary | ICD-10-CM | POA: Diagnosis not present

## 2017-11-18 DIAGNOSIS — H04123 Dry eye syndrome of bilateral lacrimal glands: Secondary | ICD-10-CM | POA: Diagnosis not present

## 2017-12-10 MED FILL — AMOXICILLIN 500 MG CAPSULE: 500 | 7 days supply | Qty: 21 | Fill #0

## 2017-12-18 ENCOUNTER — Other Ambulatory Visit (INDEPENDENT_AMBULATORY_CARE_PROVIDER_SITE_OTHER): Payer: Self-pay | Admitting: Family Medicine

## 2017-12-18 DIAGNOSIS — R7303 Prediabetes: Secondary | ICD-10-CM

## 2018-01-14 ENCOUNTER — Telehealth: Payer: 59 | Admitting: Physician Assistant

## 2018-01-14 DIAGNOSIS — J069 Acute upper respiratory infection, unspecified: Secondary | ICD-10-CM

## 2018-01-14 DIAGNOSIS — B9789 Other viral agents as the cause of diseases classified elsewhere: Secondary | ICD-10-CM | POA: Diagnosis not present

## 2018-01-14 MED ORDER — IPRATROPIUM BROMIDE 0.06 % NA SOLN: 2.0000 | Freq: Four times a day (QID) | NASAL | 12 refills | Status: DC

## 2018-01-14 MED ORDER — BENZONATATE 100 MG PO CAPS: 100.0000 mg | ORAL_CAPSULE | Freq: Three times a day (TID) | ORAL | 0 refills | Status: DC

## 2018-01-14 MED ORDER — PREDNISONE 20 MG PO TABS: 40.0000 mg | ORAL_TABLET | Freq: Every day | ORAL | 0 refills | Status: DC

## 2018-01-14 MED FILL — IPRATROPIUM 0.06% SPRAY: 0.06 | 10 days supply | Qty: 15 | Fill #0

## 2018-01-14 MED FILL — BENZONATATE 100 MG CAPS: 100 | 5 days supply | Qty: 30 | Fill #0

## 2018-01-14 MED FILL — predniSONE 20 MG TABS: 20 | 5 days supply | Qty: 10 | Fill #0

## 2018-01-14 NOTE — Progress Notes (Signed)
We are sorry you are not feeling well.  Here is how we plan to help!  Based on what you have shared with me, it looks like you may have a viral upper respiratory infection or a "common cold".  Colds are caused by a large number of viruses; however, rhinovirus is the most common cause.   Symptoms of the common cold vary from person to person, with common symptoms including sore throat, cough, and malaise.  A low-grade fever of 100.4 may present, but is often uncommon.  Symptoms vary however, and are closely related to a person's age or underlying illnesses.  The most common symptoms associated with the common cold are nasal discharge or congestion, cough, sneezing, headache and pressure in the ears and face.  Cold symptoms usually persist for about 3 to 10 days, but can last up to 2 weeks.  It is important to know that colds do not cause serious illness or complications in most cases.    The common cold is transmitted from person to person, with the most common method of transmission being a person's hands.  The virus is able to live on the skin and can infect other persons for up to 2 hours after direct contact.  Also, colds are transmitted when someone coughs or sneezes; thus, it is important to cover the mouth to reduce this risk.  To keep the spread of the common cold at bay, good hand hygiene is very important.  This is an infection that is most likely caused by a virus. There are no specific treatments for the common cold other than to help you with the symptoms until the infection runs its course.    For nasal congestion, you may use an oral decongestants such as Mucinex D or if you have glaucoma or high blood pressure use plain Mucinex.  Saline nasal spray or nasal drops can help and can safely be used as often as needed for congestion.  For your congestion, I have prescribed Ipratropium Bromide nasal spray 0.03% two sprays in each nostril 2-3 times a day  If you do not have a history of heart  disease, hypertension, diabetes or thyroid disease, prostate/bladder issues or glaucoma, you may also use Sudafed to treat nasal congestion.  It is highly recommended that you consult with a pharmacist or your primary care physician to ensure this medication is safe for you to take.     If you have a cough, you may use cough suppressants such as Delsym and Robitussin.  If you have glaucoma or high blood pressure, you can also use Coricidin HBP.   For cough I have prescribed for you A prescription cough medication called Tessalon Perles 100 mg. You may take 1-2 capsules every 8 hours as needed for cough. Given your complaint of wheezing and chest congestion I have prescribed prednisone 40 mg for five days.   If you have a sore or scratchy throat, use a saltwater gargle-  to  teaspoon of salt dissolved in a 4-ounce to 8-ounce glass of warm water.  Gargle the solution for approximately 15-30 seconds and then spit.  It is important not to swallow the solution.  You can also use throat lozenges/cough drops and Chloraseptic spray to help with throat pain or discomfort.  Warm or cold liquids can also be helpful in relieving throat pain.  For headache, pain or general discomfort, you can use Ibuprofen or Tylenol as directed.   Some authorities believe that zinc sprays or the use  of Echinacea may shorten the course of your symptoms.   HOME CARE Only take medications as instructed by your medical team. Be sure to drink plenty of fluids. Water is fine as well as fruit juices, sodas and electrolyte beverages. You may want to stay away from caffeine or alcohol. If you are nauseated, try taking small sips of liquids. How do you know if you are getting enough fluid? Your urine should be a pale yellow or almost colorless. Get rest. Taking a steamy shower or using a humidifier may help nasal congestion and ease sore throat pain. You can place a towel over your head and breathe in the steam from hot water coming from  a faucet. Using a saline nasal spray works much the same way. Cough drops, hard candies and sore throat lozenges may ease your cough. Avoid close contacts especially the very young and the elderly Cover your mouth if you cough or sneeze Always remember to wash your hands.   GET HELP RIGHT AWAY IF: You develop worsening fever. If your symptoms do not improve within 10 days You develop yellow or green discharge from your nose over 3 days. You have coughing fits You develop a severe head ache or visual changes. You develop shortness of breath, difficulty breathing or start having chest pain  Your symptoms persist after you have completed your treatment plan  MAKE SURE YOU  Understand these instructions. Will watch your condition. Will get help right away if you are not doing well or get worse.  Your e-visit answers were reviewed by a board certified advanced clinical practitioner to complete your personal care plan. Depending upon the condition, your plan could have included both over the counter or prescription medications. Please review your pharmacy choice. If there is a problem, you may call our nursing hot line at and have the prescription routed to another pharmacy. Your safety is important to [name]. If you have drug allergies check your prescription carefully.   You can use MyChart to ask questions about today's visit, request a non-urgent call back, or ask for a work or school excuse for 24 hours related to this e-Visit. If it has been greater than 24 hours you will need to follow up with your provider, or enter a new e-Visit to address those concerns. You will get an e-mail in the next two days asking about your experience.  I hope that your e-visit has been valuable and will speed your recovery. Thank you for using e-visits.      ===View-only below this line===   ----- Message -----    From: Jessica Yoder    Sent: 01/14/2018 11:39 AM EST      To: E-Visit Mailing  List Subject: E-Visit Submission: Cough  E-Visit Submission: Cough --------------------------------  Question: How long have you been coughing? Answer:   4 days  Question: How would you describe the cough? Answer:   A cough from congested lungs  Question: How often are you coughing? Answer:   Infrequently but steadily  Question: Does the cough prevent you from sleeping at night? Answer:   Yes  Question: What other symptoms have you experienced with the cough? Answer:   Blocked sinuses            Wheezing  Question: Do you have a fever? Answer:   No, I do not have a fever  Question: Are you coughing up any mucus? Answer:   I am coughing up a little bit of mucus  Question:  Do you use a maintenance inhaler? Answer:   No  Question: Do you use a rescue inhaler (such as Ventolin?) Answer:   No  Question: Have you previously required a prescription for prednisone for cough? Answer:   Yes  Question: Are you diabetic? Answer:   No  Question: Are you pregnant? Answer:   I am confident that I am not pregnant  Question: Are you breastfeeding? Answer:   No  Question: What is the appearance of the mucus? Answer:   The mucus is thick  Question: Do you have any of the following? Answer:   None of the above  Question: Do you smoke? Answer:   No  Question: Have you ever smoked? Answer:   I have never smoked  Question: Are there people you know with similar symptoms? Answer:   No  Question: Are you experiencing any of the following? Answer:   None of  the above  Question: Are you having difficulty breathing? Answer:   No  Question: Is your coughing worse when you are exposed to pollen, dust, or other things in the environment? Answer:   No  Question: Have you been treated for a similar cough in the past? Answer:   Yes  Question: What treatments have worked in the past?  Answer:   I usually get bronchitis every wintery season. This feels the same. Usually get a z  pack. Prednisone. And albuterol inhaler  Question: What treatment(s) in the past have been unsuccessful? Answer:   Otc cough syrups and treatments  Question: Have you ever been diagnosed with asthma, bronchitis, or lung disease? Answer:   Yes  Question: Please enter a few details about your earlier diagnosis and treatment Answer:   Bronchitis usually every cold season.  Question: Have you recently started on any medications for your heart or for blood pressure? Answer:   No  Question: Have you recently been hospitalized? Answer:   No  Question: Please list your medication allergies that you may have ? (If 'none' , please list as 'none') Answer:   Amitrypilline-rash            Saxenda -rash  Question: Please list any additional comments  Answer:   Symptoms: dry hacking cough, mild wheezing primarily on expiration,

## 2018-01-20 ENCOUNTER — Encounter (HOSPITAL_COMMUNITY): Payer: Self-pay

## 2018-01-24 ENCOUNTER — Ambulatory Visit: Payer: 59 | Admitting: Nurse Practitioner

## 2018-02-06 ENCOUNTER — Ambulatory Visit: Payer: 59 | Admitting: Nurse Practitioner

## 2018-02-06 ENCOUNTER — Encounter: Payer: Self-pay | Admitting: Nurse Practitioner

## 2018-02-06 VITALS — BP 110/70 | HR 71 | Temp 98.3°F | Ht 67.0 in | Wt 336.8 lb

## 2018-02-06 DIAGNOSIS — Z23 Encounter for immunization: Secondary | ICD-10-CM | POA: Diagnosis not present

## 2018-02-06 DIAGNOSIS — E559 Vitamin D deficiency, unspecified: Secondary | ICD-10-CM | POA: Diagnosis not present

## 2018-02-06 DIAGNOSIS — Z111 Encounter for screening for respiratory tuberculosis: Secondary | ICD-10-CM

## 2018-02-06 DIAGNOSIS — R7303 Prediabetes: Secondary | ICD-10-CM | POA: Diagnosis not present

## 2018-02-06 MED ORDER — METFORMIN HCL 500 MG PO TABS: 500.0000 mg | ORAL_TABLET | Freq: Every day | ORAL | 1 refills | Status: DC

## 2018-02-06 MED ORDER — TETANUS-DIPHTH-ACELL PERTUSSIS 5-2.5-18.5 LF-MCG/0.5 IM SUSP
0.5000 mL | Freq: Once | INTRAMUSCULAR | Status: AC
  Administered 2018-02-06: 0.5 mL via INTRAMUSCULAR

## 2018-02-06 MED FILL — metFORMIN HCL 500 MG TABS: 500 | 90 days supply | Qty: 90 | Fill #0

## 2018-02-06 NOTE — Progress Notes (Signed)
Subjective:     Patient ID: Jessica Yoder , female    DOB: 1976-01-01 , 43 y.o.   MRN: [id number]   Chief Complaint  Patient presents with  . PPD AND Skin Test    HPI  She is working at [employer] and needs TB skin test and TDAP (last done Mar 03, 2007) for work.  No allergies to eggs.  She has had positive TB skin test in 2001, 2003, 2004, 2005, 2006, 2008, 2009, 2010, 2011.  Has also had a CXR previously which was negative.  TB quantiferon has been negative as well.   Diabetes  She presents for her follow-up diabetic visit. Diabetes type: prediabetes. Her disease course has been stable. Pertinent negatives for hypoglycemia include no confusion, dizziness, headaches or nervousness/anxiousness. Pertinent negatives for diabetes include no chest pain, no fatigue, no polydipsia, no polyphagia and no polyuria. There are no hypoglycemic complications. There are no diabetic complications. Risk factors for coronary artery disease include obesity and sedentary lifestyle. Current diabetic treatment includes oral agent (monotherapy). She is compliant with treatment all of the time. She has had a previous visit with a dietitian. She rarely participates in exercise. Home blood sugar record trend: does not check  ACE inhibitor/angiotensin II receptor blocker: prediabetic.     Past Medical History:  Diagnosis Date  . Allergy   . Elevated hemoglobin A1c   . Migraine   . Vitamin D deficiency      Family History  Problem Relation Age of Onset  . Other Father        ETOH Abuse  . Thyroid disease Mother   . Obesity Mother   . Cancer Other   . Hyperlipidemia Other   . Stroke Other   . Diabetes Other   . Obesity Other   . Sleep apnea Other      Current Outpatient Medications:  .  levonorgestrel (MIRENA) 20 MCG/24HR IUD, 1 each by Intrauterine route once., Disp: , Rfl:  .  metFORMIN (GLUCOPHAGE) 500 MG tablet, Take 1 tablet (500 mg total) by mouth daily with breakfast., Disp: 30 tablet, Rfl: 0 .   Vitamin D, Ergocalciferol, (DRISDOL) 50000 units CAPS capsule, Take 1 capsule (50,000 Units total) by mouth every 7 (seven) days., Disp: 4 capsule, Rfl: 0   Allergies  Allergen Reactions  . Amitriptyline Rash  . Latex Rash  . Saxenda [Liraglutide -Weight Management] Itching    Injection site     Review of Systems  Constitutional: Negative.  Negative for fatigue.  Respiratory: Negative.   Cardiovascular: Negative.  Negative for chest pain, palpitations and leg swelling.  Gastrointestinal: Negative.   Endocrine: Negative.  Negative for polydipsia, polyphagia and polyuria.  Skin: Negative.   Neurological: Negative.  Negative for dizziness and headaches.  Psychiatric/Behavioral: Negative for confusion. The patient is not nervous/anxious.      Today's Vitals   02/06/18 1050  BP: 110/70  Pulse: 71  Temp: 98.3 F (36.8 C)  TempSrc: Oral  SpO2: 93%  Weight: (!) 336 lb 12.8 oz (152.8 kg)  Height: 5\' 7"  (1.702 m)  PainSc: 0-No pain   Body mass index is 52.75 kg/m.   Objective:  Physical Exam Vitals signs reviewed.  Constitutional:      Appearance: She is well-developed.  Neck:     Musculoskeletal: Normal range of motion and neck supple.  Cardiovascular:     Rate and Rhythm: Normal rate and regular rhythm.     Heart sounds: Normal heart sounds. No murmur.  Pulmonary:  Effort: Pulmonary effort is normal.     Breath sounds: Normal breath sounds.  Chest:     Chest wall: No tenderness.  Musculoskeletal: Normal range of motion.  Skin:    General: Skin is warm and dry.     Capillary Refill: Capillary refill takes less than 2 seconds.  Neurological:     General: No focal deficit present.     Mental Status: She is alert and oriented to person, place, and time.         Assessment And Plan:     1. Encounter for TB tine test  She has had approximately 9 positive skin test with a CXR  Will check quantiferon and will check CXR if not accepted at her job -  QuantiFERON-TB Gold Plus  2. Encounter for immunization  Will give tetanus vaccine today while in office. Refer to order management. TDAP will be administered to adults 77-51 years old every 10 years. - Tdap (BOOSTRIX) injection 0.5 mL  3. Prediabetes  Chronic, will check HgbA1c  Continue with current medications  Encouraged to limit intake of sugary foods and drinks  Encouraged to increase physical activity to 150 minutes per week - metFORMIN (GLUCOPHAGE) 500 MG tablet; Take 1 tablet (500 mg total) by mouth daily with breakfast.  Dispense: 90 tablet; Refill: 1 - Hemoglobin A1c  4. Vitamin D deficiency  Will check vitamin D level and supplement as needed.     Also encouraged to spend 15 minutes in the sun daily.  - Vitamin D (25 hydroxy)       Arnette Felts, FNP

## 2018-02-10 LAB — VITAMIN D 25 HYDROXY (VIT D DEFICIENCY, FRACTURES): Vit D, 25-Hydroxy: 12.8 ng/mL — ABNORMAL LOW (ref 30.0–100.0)

## 2018-02-10 LAB — QUANTIFERON-TB GOLD PLUS
QuantiFERON Mitogen Value: >10 IU/mL
QuantiFERON Nil Value: 0.04 IU/mL
QuantiFERON TB1 Ag Value: 0.04 IU/mL
QuantiFERON TB2 Ag Value: 0.04 IU/mL
QuantiFERON-TB Gold Plus: NEGATIVE

## 2018-02-10 LAB — HEMOGLOBIN A1C
Est. average glucose Bld gHb Est-mCnc: 123 mg/dL
Hgb A1c MFr Bld: 5.9 % — ABNORMAL HIGH (ref 4.8–5.6)

## 2018-02-13 ENCOUNTER — Other Ambulatory Visit: Payer: Self-pay | Admitting: Nurse Practitioner

## 2018-02-13 ENCOUNTER — Encounter: Payer: Self-pay | Admitting: Nurse Practitioner

## 2018-02-13 DIAGNOSIS — E559 Vitamin D deficiency, unspecified: Secondary | ICD-10-CM

## 2018-02-13 MED ORDER — VITAMIN D (ERGOCALCIFEROL) 1.25 MG (50000 UNIT) PO CAPS
50000.0000 [IU] | ORAL_CAPSULE | ORAL | 1 refills | Status: DC
Start: 2018-02-13 — End: 2018-04-24

## 2018-02-13 MED FILL — VIT D2 1.25 MG (50,000 UNIT: 1.25 MG | 84 days supply | Qty: 12 | Fill #0

## 2018-02-13 MED FILL — IPRATROPIUM 0.06% SPRAY: 0.06 | 10 days supply | Qty: 15 | Fill #0

## 2018-02-25 ENCOUNTER — Telehealth: Payer: 59 | Admitting: Physician Assistant

## 2018-02-25 DIAGNOSIS — J22 Unspecified acute lower respiratory infection: Secondary | ICD-10-CM | POA: Diagnosis not present

## 2018-02-25 MED ORDER — PREDNISONE 20 MG PO TABS: ORAL_TABLET | ORAL | 0 refills | Status: AC

## 2018-02-25 MED ORDER — AZITHROMYCIN 250 MG PO TABS: ORAL_TABLET | ORAL | 0 refills | Status: AC

## 2018-02-25 MED ORDER — BENZONATATE 100 MG PO CAPS: 100.0000 mg | ORAL_CAPSULE | Freq: Three times a day (TID) | ORAL | 0 refills | Status: DC

## 2018-02-25 MED FILL — predniSONE 20 MG TABS: 20 | 5 days supply | Qty: 10 | Fill #0

## 2018-02-25 MED FILL — AZITHROMYCIN 250 MG TABLET: 250 | 5 days supply | Qty: 6 | Fill #0

## 2018-02-25 MED FILL — BENZONATATE 100 MG CAPS: 100 | 5 days supply | Qty: 30 | Fill #0

## 2018-02-25 NOTE — Progress Notes (Signed)
We are sorry that you are not feeling well.  Here is how we plan to help!  Based on your presentation I believe you most likely have A cough due to bacteria.  When patients have a fever and a productive cough with a change in color or increased sputum production, we are concerned about bacterial bronchitis.  If left untreated it can progress to pneumonia.  If your symptoms do not improve with your treatment plan it is important that you contact your provider.   I have prescribed Azithromyin 250 mg: two tablets now and then one tablet daily for 4 additonal days    In addition you may use A prescription cough medication called Tessalon Perles 100mg . You may take 1-2 capsules every 8 hours as needed for your cough. I have also prescribed a steroid burst to help speed your recovery.   From your responses in the eVisit questionnaire you describe inflammation in the upper respiratory tract which is causing a significant cough.  This is commonly called Bronchitis and has four common causes:    Allergies  Viral Infections  Acid Reflux  Bacterial Infection Allergies, viruses and acid reflux are treated by controlling symptoms or eliminating the cause. An example might be a cough caused by taking certain blood pressure medications. You stop the cough by changing the medication. Another example might be a cough caused by acid reflux. Controlling the reflux helps control the cough.  USE OF BRONCHODILATOR ("RESCUE") INHALERS: There is a risk from using your bronchodilator too frequently.  The risk is that over-reliance on a medication which only relaxes the muscles surrounding the breathing tubes can reduce the effectiveness of medications prescribed to reduce swelling and congestion of the tubes themselves.  Although you feel brief relief from the bronchodilator inhaler, your asthma may actually be worsening with the tubes becoming more swollen and filled with mucus.  This can delay other crucial treatments,  such as oral steroid medications. If you need to use a bronchodilator inhaler daily, several times per day, you should discuss this with your provider.  There are probably better treatments that could be used to keep your asthma under control.     HOME CARE . Only take medications as instructed by your medical team. . Complete the entire course of an antibiotic. . Drink plenty of fluids and get plenty of rest. . Avoid close contacts especially the very young and the elderly . Cover your mouth if you cough or cough into your sleeve. . Always remember to wash your hands . A steam or ultrasonic humidifier can help congestion.   GET HELP RIGHT AWAY IF: . You develop worsening fever. . You become short of breath . You cough up blood. . Your symptoms persist after you have completed your treatment plan MAKE SURE YOU   Understand these instructions.  Will watch your condition.  Will get help right away if you are not doing well or get worse.  Your e-visit answers were reviewed by a board certified advanced clinical practitioner to complete your personal care plan.  Depending on the condition, your plan could have included both over the counter or prescription medications. If there is a problem please reply  once you have received a response from your provider. Your safety is important to [name].  If you have drug allergies check your prescription carefully.    You can use MyChart to ask questions about today's visit, request a non-urgent call back, or ask for a work or school  excuse for 24 hours related to this e-Visit. If it has been greater than 24 hours you will need to follow up with your provider, or enter a new e-Visit to address those concerns. You will get an e-mail in the next two days asking about your experience.  I hope that your e-visit has been valuable and will speed your recovery. Thank you for using e-visits.

## 2018-02-26 MED ORDER — ALBUTEROL SULFATE HFA 108 (90 BASE) MCG/ACT IN AERS: 2.0000 | INHALATION_SPRAY | Freq: Four times a day (QID) | RESPIRATORY_TRACT | 0 refills | Status: DC | PRN

## 2018-02-26 NOTE — Addendum Note (Signed)
Addended by: Jannifer Rodney A on: 02/26/2018 04:51 PM   Modules accepted: Orders

## 2018-03-10 ENCOUNTER — Encounter (INDEPENDENT_AMBULATORY_CARE_PROVIDER_SITE_OTHER): Payer: Self-pay | Admitting: Bariatrics

## 2018-03-10 ENCOUNTER — Ambulatory Visit (INDEPENDENT_AMBULATORY_CARE_PROVIDER_SITE_OTHER): Payer: 59 | Admitting: Bariatrics

## 2018-03-10 VITALS — BP 126/84 | HR 89 | Temp 98.5°F | Ht 66.0 in | Wt 336.0 lb

## 2018-03-10 DIAGNOSIS — Z6841 Body Mass Index (BMI) 40.0 and over, adult: Secondary | ICD-10-CM | POA: Diagnosis not present

## 2018-03-10 DIAGNOSIS — R7303 Prediabetes: Secondary | ICD-10-CM

## 2018-03-10 DIAGNOSIS — E559 Vitamin D deficiency, unspecified: Secondary | ICD-10-CM | POA: Diagnosis not present

## 2018-03-10 DIAGNOSIS — Z9189 Other specified personal risk factors, not elsewhere classified: Secondary | ICD-10-CM | POA: Diagnosis not present

## 2018-03-10 MED ORDER — METFORMIN HCL 500 MG PO TABS: 500.0000 mg | ORAL_TABLET | Freq: Two times a day (BID) | ORAL | 0 refills | Status: DC

## 2018-03-11 DIAGNOSIS — Z6841 Body Mass Index (BMI) 40.0 and over, adult: Secondary | ICD-10-CM

## 2018-03-11 NOTE — Progress Notes (Signed)
Office: [phone]  /  Fax: [phone]   HPI:   Chief Complaint: OBESITY Jessica Yoder is here to discuss her progress with her obesity treatment plan. She is on the keep a food journal with 500 to 600 calories and 40 grams of protein at supper daily and the Category 3 plan and is following her eating plan approximately 0 % of the time. She states she is walking 20 to 30 minutes 3 to 4 times per week. Jessica Yoder has not been here since 10/03/17. She is not following the plan. Jessica Yoder is sleepy after she eats and she is hungry in the evening. Jessica Yoder has cravings for sweets. Her weight is (!) 336 lb (152.4 kg) today and she has maintained weight over a period of 22 weeks since her last visit. She has gained 4 lbs since starting treatment with [name].  Pre-Diabetes Jessica Yoder has a diagnosis of prediabetes based on her elevated Hgb A1c and was informed this puts her at greater risk of developing diabetes. Her last A1c was at 5.9 She is taking metformin currently and continues to work on diet and exercise to decrease risk of diabetes. She denies nausea or hypoglycemia.  At risk for diabetes Jessica Yoder is at higher than average risk for developing diabetes due to her obesity and prediabetes. She currently denies polyuria or polydipsia.  Vitamin D deficiency Jessica Yoder has a diagnosis of vitamin D deficiency. Her last vitamin D level was at 12.8 She is currently taking vit D and denies nausea, vomiting or muscle weakness.  ASSESSMENT AND PLAN:  Prediabetes - Plan: metFORMIN (GLUCOPHAGE) 500 MG tablet  Vitamin D deficiency  At risk for diabetes mellitus  Class 3 severe obesity with serious comorbidity and body mass index (BMI) of 50.0 to 59.9 in adult, unspecified obesity type Jessica Yoder)  PLAN:  Pre-Diabetes Jessica Yoder will continue to work on weight loss, exercise, and decreasing simple carbohydrates in her diet to help decrease the risk of diabetes. We dicussed metformin including benefits and risks. She was informed  that eating too many simple carbohydrates or too many calories at one sitting increases the likelihood of GI side effects. Jessica Yoder agrees to increase Metformin 500 mg from once daily to twice daily #60 with no refills and follow up with [name] as directed to monitor her progress.  Diabetes risk counseling Jessica Yoder was given extended (15 minutes) diabetes prevention counseling today. She is 42 y.o. female and has risk factors for diabetes including obesity and prediabetes. We discussed intensive lifestyle modifications today with an emphasis on weight loss as well as increasing exercise and decreasing simple carbohydrates in her diet.  Vitamin D Deficiency Jessica Yoder was informed that low vitamin D levels contributes to fatigue and are associated with obesity, breast, and colon cancer. She agrees to continue to take prescription Vit D @50 ,000 IU every week and will follow up for routine testing of vitamin D, at least 2-3 times per year. She was informed of the risk of over-replacement of vitamin D and agrees to not increase her dose unless she discusses this with [name] first.  Obesity Jessica Yoder is currently in the action stage of change. As such, her goal is to continue with weight loss efforts She has agreed to keep a food journal with 500 to 600 calories and 40 grams of protein supper daily and follow the Category 1 plan Jessica Yoder will continue walking at work for weight loss and overall health benefits. We discussed the following Behavioral Modification Strategies today: increase H2O intake, no skipping meals, keeping  healthy foods in the home, increasing lean protein intake, decreasing simple carbohydrates, increasing vegetables, work on meal planning and easy cooking plans, ways to avoid boredom eating and ways to avoid night time snacking  Jessica Yoder has agreed to follow up with our clinic in 2 weeks. She was informed of the importance of frequent follow up visits to maximize her success with intensive lifestyle  modifications for her multiple health conditions.  ALLERGIES: Allergies  Allergen Reactions  . Amitriptyline Rash  . Latex Rash  . Saxenda [Liraglutide -Weight Management] Itching    Injection site    MEDICATIONS: Current Outpatient Medications on File Prior to Visit  Medication Sig Dispense Refill  . albuterol (PROVENTIL HFA;VENTOLIN HFA) 108 (90 Base) MCG/ACT inhaler Inhale 2 puffs into the lungs every 6 (six) hours as needed for wheezing or shortness of breath. 1 Inhaler 0  . benzonatate (TESSALON) 100 MG capsule Take 1-2 capsules (100-200 mg total) by mouth 3 (three) times daily. 30 capsule 0  . levonorgestrel (MIRENA) 20 MCG/24HR IUD 1 each by Intrauterine route once.    . Vitamin D, Ergocalciferol, (DRISDOL) 1.25 MG (50000 UT) CAPS capsule Take 1 capsule (50,000 Units total) by mouth every 7 (seven) days. 24 capsule 1   No current facility-administered medications on file prior to visit.     PAST MEDICAL HISTORY: Past Medical History:  Diagnosis Date  . Allergy   . Elevated hemoglobin A1c   . Migraine   . Vitamin D deficiency     PAST SURGICAL HISTORY: Past Surgical History:  Procedure Laterality Date  . EYE SURGERY  2011   Lasik  . LAPAROSCOPIC GASTRIC BANDING  06/28/2008    SOCIAL HISTORY: Social History   Tobacco Use  . Smoking status: Never Smoker  . Smokeless tobacco: Never Used  Substance Use Topics  . Alcohol use: Yes    Comment: socially  . Drug use: No    FAMILY HISTORY: Family History  Problem Relation Age of Onset  . Other Father        ETOH Abuse  . Thyroid disease Mother   . Obesity Mother   . Cancer Other   . Hyperlipidemia Other   . Stroke Other   . Diabetes Other   . Obesity Other   . Sleep apnea Other     ROS: Review of Systems  Constitutional: Negative for weight loss.  Gastrointestinal: Negative for nausea and vomiting.  Genitourinary: Negative for frequency.  Musculoskeletal:       Negative for muscle weakness    Endo/Heme/Allergies: Negative for polydipsia.       Negative for hypoglycemia    PHYSICAL EXAM: Blood pressure 126/84, pulse 89, temperature 98.5 F (36.9 C), temperature source Oral, height 5\' 6"  (1.676 m), weight (!) 336 lb (152.4 kg), SpO2 96 %. Body mass index is 54.23 kg/m. Physical Exam Vitals signs reviewed.  Constitutional:      Appearance: Normal appearance. She is well-developed. She is obese.  Cardiovascular:     Rate and Rhythm: Normal rate.  Pulmonary:     Effort: Pulmonary effort is normal.  Musculoskeletal: Normal range of motion.  Skin:    General: Skin is warm and dry.  Neurological:     Mental Status: She is alert and oriented to person, place, and time.  Psychiatric:        Mood and Affect: Mood normal.        Behavior: Behavior normal.     RECENT LABS AND TESTS: BMET    Component  Value Date/Time   NA 142 06/04/2017 1205   K 4.5 06/04/2017 1205   CL 105 06/04/2017 1205   CO2 25 06/04/2017 1205   GLUCOSE 81 06/04/2017 1205   GLUCOSE 105 (H) 12/12/2014 1724   BUN 12 06/04/2017 1205   CREATININE 0.71 06/04/2017 1205   CALCIUM 9.3 06/04/2017 1205   GFRNONAA 105 06/04/2017 1205   GFRAA 121 06/04/2017 1205   Lab Results  Component Value Date   HGBA1C 5.9 (H) 02/06/2018   HGBA1C 5.8 (H) 06/04/2017   Lab Results  Component Value Date   INSULIN 28.5 (H) 06/04/2017   CBC    Component Value Date/Time   WBC 5.4 06/04/2017 1205   WBC 8.9 06/29/2008 0430   RBC 4.49 06/04/2017 1205   RBC 4.28 06/29/2008 0430   HGB 13.2 06/04/2017 1205   HCT 40.6 06/04/2017 1205   PLT 332 06/29/2008 0430   MCV 90 06/04/2017 1205   MCH 29.4 06/04/2017 1205   MCHC 32.5 06/04/2017 1205   MCHC 33.9 06/29/2008 0430   RDW 13.2 06/04/2017 1205   LYMPHSABS 2.6 06/04/2017 1205   MONOABS 0.5 06/29/2008 0430   EOSABS 0.2 06/04/2017 1205   BASOSABS 0.0 06/04/2017 1205   Iron/TIBC/Ferritin/ %Sat No results found for: IRON, TIBC, FERRITIN, IRONPCTSAT Lipid Panel      Component Value Date/Time   CHOL 163 06/04/2017 1205   TRIG 63 06/04/2017 1205   HDL 46 06/04/2017 1205   LDLCALC 104 (H) 06/04/2017 1205   Hepatic Function Panel     Component Value Date/Time   PROT 7.0 06/04/2017 1205   ALBUMIN 4.6 06/04/2017 1205   AST 17 06/04/2017 1205   ALT 14 06/04/2017 1205   ALKPHOS 78 06/04/2017 1205   BILITOT <0.2 06/04/2017 1205      Component Value Date/Time   TSH 1.550 06/04/2017 1205     Ref. Range 02/06/2018 11:25  Vitamin D, 25-Hydroxy Latest Ref Range: 30.0 - 100.0 ng/mL 12.8 (L)     OBESITY BEHAVIORAL INTERVENTION VISIT  Today's visit was # 7   Starting weight: 332 lbs Starting date: 06/04/2017 Today's weight : 336 lbs  Today's date: 03/10/2018 Total lbs lost to date: 0   ASK: We discussed the diagnosis of obesity with Jessica Yoder today and Jessica Yoder agreed to give [name] permission to discuss obesity behavioral modification therapy today.  ASSESS: Jessica Yoder has the diagnosis of obesity and her BMI today is 54.26 Jessica Yoder is in the action stage of change   ADVISE: Jessica Yoder was educated on the multiple health risks of obesity as well as the benefit of weight loss to improve her health. She was advised of the need for long term treatment and the importance of lifestyle modifications to improve her current health and to decrease her risk of future health problems.  AGREE: Multiple dietary modification options and treatment options were discussed and  Jessica Yoder agreed to follow the recommendations documented in the above note.  ARRANGE: Jessica Yoder was educated on the importance of frequent visits to treat obesity as outlined per CMS and USPSTF guidelines and agreed to schedule her next follow up appointment today.  Jessica Yoder, am acting as Energy manager for [employer]. Manson Passey, DO  I have reviewed the above documentation for accuracy and completeness, and I agree with the above. -Corinna Capra, DO

## 2018-03-25 ENCOUNTER — Ambulatory Visit (INDEPENDENT_AMBULATORY_CARE_PROVIDER_SITE_OTHER): Payer: 59 | Admitting: Bariatrics

## 2018-03-25 ENCOUNTER — Encounter (INDEPENDENT_AMBULATORY_CARE_PROVIDER_SITE_OTHER): Payer: Self-pay | Admitting: Bariatrics

## 2018-03-25 VITALS — BP 114/75 | HR 83 | Temp 98.2°F | Ht 66.0 in | Wt 337.0 lb

## 2018-03-25 DIAGNOSIS — E559 Vitamin D deficiency, unspecified: Secondary | ICD-10-CM | POA: Diagnosis not present

## 2018-03-25 DIAGNOSIS — F3289 Other specified depressive episodes: Secondary | ICD-10-CM

## 2018-03-25 DIAGNOSIS — R7303 Prediabetes: Secondary | ICD-10-CM

## 2018-03-25 DIAGNOSIS — Z6841 Body Mass Index (BMI) 40.0 and over, adult: Secondary | ICD-10-CM

## 2018-03-25 MED ORDER — METFORMIN HCL 500 MG PO TABS: 500.0000 mg | ORAL_TABLET | Freq: Two times a day (BID) | ORAL | 0 refills | Status: DC

## 2018-03-25 MED ORDER — BUPROPION HCL ER (SR) 150 MG PO TB12: 150.0000 mg | ORAL_TABLET | Freq: Every day | ORAL | 0 refills | Status: DC

## 2018-03-25 MED FILL — BUPROPION HCL SR 150 MG TAB: 150 | 30 days supply | Qty: 30 | Fill #0

## 2018-03-26 NOTE — Progress Notes (Signed)
Office: [phone]  /  Fax: [phone]   HPI:   Chief Complaint: OBESITY Jessica Yoder is here to discuss her progress with her obesity treatment plan. She is on the keep a food journal with 500 to 600 calories and 40 grams of protein at supper daily and the Category 1 plan and is following her eating plan approximately 85 % of the time. She states she is walking for 20 to 30 minutes 3 times per week. Jessica Yoder is doing well overall, but she gained one pound. Jessica Yoder went out of town over the weekend. Jessica Yoder is also attending [employer] (wt. decreased 1.8 lbs.). Jessica Yoder is still having some cravings in the evening. Her weight is (!) 337 lb (152.9 kg) today and has had a weight gain of 1 pound over a period of 2 weeks since her last visit. She has gained 5 lbs since starting treatment with [name].  Pre-Diabetes Jessica Yoder has a diagnosis of prediabetes based on her elevated Hgb A1c and was informed this puts her at greater risk of developing diabetes. Her last A1c was at 5.9 She is taking metformin currently and continues to work on diet and exercise to decrease risk of diabetes. She denies polyphagia.  At risk for diabetes Johnsie is at higher than average risk for developing diabetes due to her obesity and prediabetes. She currently denies polyuria or polydipsia.  Vitamin D deficiency Jessica Yoder has a diagnosis of vitamin D deficiency. She is currently taking high dose vit D and denies nausea, vomiting or muscle weakness.  Depression with emotional eating behaviors Jessica Yoder struggles with emotional eating and using food for comfort to the extent that it is negatively impacting her health. She often snacks when she is not hungry. Jessica Yoder sometimes feels she is out of control and then feels guilty that she made poor food choices. She craves salty and crunchy snacks. She has been working on behavior modification techniques to help reduce her emotional eating and has been somewhat successful. She shows no sign of  suicidal or homicidal ideations.  Depression screen [facility] 2/9 02/06/2018 06/04/2017 03/23/2015 09/14/2014  Decreased Interest 0 1 0 0  Down, Depressed, Hopeless 0 1 0 0  PHQ - 2 Score 0 2 0 0  Altered sleeping - 0 - -  Tired, decreased energy - 2 - -  Change in appetite - 3 - -  Feeling bad or failure about yourself  - 2 - -  Trouble concentrating - 0 - -  Moving slowly or fidgety/restless - 0 - -  Suicidal thoughts - 0 - -  PHQ-9 Score - 9 - -  Difficult doing work/chores - Somewhat difficult - -    ASSESSMENT AND PLAN:  Prediabetes - Plan: metFORMIN (GLUCOPHAGE) 500 MG tablet  Vitamin D deficiency  Other depression - emotional eating - Plan: buPROPion (WELLBUTRIN SR) 150 MG 12 hr tablet  Class 3 severe obesity with serious comorbidity and body mass index (BMI) of 50.0 to 59.9 in adult, unspecified obesity type (HCC)  PLAN:  Pre-Diabetes Jessica Yoder will continue to work on weight loss, exercise, and decreasing simple carbohydrates in her diet to help decrease the risk of diabetes. We dicussed metformin including benefits and risks. She was informed that eating too many simple carbohydrates or too many calories at one sitting increases the likelihood of GI side effects. Jessica Yoder agreed to continue metformin 500 mg BID #60 with no refills and follow up with [name] as directed to monitor her progress.  Diabetes risk counseling Jessica Yoder was  given extended (15 minutes) diabetes prevention counseling today. She is 43 y.o. female and has risk factors for diabetes including obesity and prediabetes. We discussed intensive lifestyle modifications today with an emphasis on weight loss as well as increasing exercise and decreasing simple carbohydrates in her diet.  Vitamin D Deficiency Jessica Yoder was informed that low vitamin D levels contributes to fatigue and are associated with obesity, breast, and colon cancer. She agrees to continue to take prescription Vit D @50 ,000 IU every week and will follow up for  routine testing of vitamin D, at least 2-3 times per year. She was informed of the risk of over-replacement of vitamin D and agrees to not increase her dose unless she discusses this with [name] first.  Depression with Emotional Eating Behaviors We discussed behavior modification techniques today to help Jessica Yoder deal with her emotional eating and depression. She has agreed to take Wellbutrin SR 150 mg daily #30 with no refills and follow up as directed.  Obesity Jessica Yoder is currently in the action stage of change. As such, her goal is to continue with weight loss efforts She has agreed to keep a food journal with 500 to 600 calories and 40 grams of protein at supper daily and follow the Category 3 plan Jessica Yoder has been instructed to work up to a goal of 150 minutes of combined cardio and strengthening exercise per week for weight loss and overall health benefits. We discussed the following Behavioral Modification Strategies today: increase H2O intake, no skipping meals, keeping healthy foods in the home, increasing lean protein intake, decreasing simple carbohydrates, increasing vegetables, decrease eating out and work on meal planning and easy cooking plans  Jessica Yoder has agreed to follow up with our clinic in 2 weeks. She was informed of the importance of frequent follow up visits to maximize her success with intensive lifestyle modifications for her multiple health conditions.  ALLERGIES: Allergies  Allergen Reactions   Amitriptyline Rash   Latex Rash   Saxenda [Liraglutide -Weight Management] Itching    Injection site    MEDICATIONS: Current Outpatient Medications on File Prior to Visit  Medication Sig Dispense Refill   albuterol (PROVENTIL HFA;VENTOLIN HFA) 108 (90 Base) MCG/ACT inhaler Inhale 2 puffs into the lungs every 6 (six) hours as needed for wheezing or shortness of breath. 1 Inhaler 0   benzonatate (TESSALON) 100 MG capsule Take 1-2 capsules (100-200 mg total) by mouth 3 (three)  times daily. 30 capsule 0   levonorgestrel (MIRENA) 20 MCG/24HR IUD 1 each by Intrauterine route once.     Vitamin D, Ergocalciferol, (DRISDOL) 1.25 MG (50000 UT) CAPS capsule Take 1 capsule (50,000 Units total) by mouth every 7 (seven) days. 24 capsule 1   No current facility-administered medications on file prior to visit.     PAST MEDICAL HISTORY: Past Medical History:  Diagnosis Date   Allergy    Elevated hemoglobin A1c    Migraine    Vitamin D deficiency     PAST SURGICAL HISTORY: Past Surgical History:  Procedure Laterality Date   EYE SURGERY  2011   Lasik   LAPAROSCOPIC GASTRIC BANDING  06/28/2008    SOCIAL HISTORY: Social History   Tobacco Use   Smoking status: Never Smoker   Smokeless tobacco: Never Used  Substance Use Topics   Alcohol use: Yes    Comment: socially   Drug use: No    FAMILY HISTORY: Family History  Problem Relation Age of Onset   Other Father  ETOH Abuse   Thyroid disease Mother    Obesity Mother    Cancer Other    Hyperlipidemia Other    Stroke Other    Diabetes Other    Obesity Other    Sleep apnea Other     ROS: Review of Systems  Constitutional: Negative for weight loss.  Gastrointestinal: Negative for nausea and vomiting.  Genitourinary: Negative for frequency.  Musculoskeletal:       Negative for muscle weakness  Endo/Heme/Allergies: Negative for polydipsia.       Negative for polyphagia Positive for cravings  Psychiatric/Behavioral: Positive for depression. Negative for suicidal ideas.    PHYSICAL EXAM: Blood pressure 114/75, pulse 83, temperature 98.2 F (36.8 C), temperature source Oral, height 5\' 6"  (1.676 m), weight (!) 337 lb (152.9 kg), SpO2 99 %. Body mass index is 54.39 kg/m. Physical Exam Vitals signs reviewed.  Constitutional:      Appearance: Normal appearance. She is well-developed. She is obese.  Cardiovascular:     Rate and Rhythm: Normal rate.  Pulmonary:     Effort:  Pulmonary effort is normal.  Musculoskeletal: Normal range of motion.  Skin:    General: Skin is warm and dry.  Neurological:     Mental Status: She is alert and oriented to person, place, and time.  Psychiatric:        Mood and Affect: Mood normal.        Behavior: Behavior normal.        Thought Content: Thought content does not include homicidal or suicidal ideation.     RECENT LABS AND TESTS: BMET    Component Value Date/Time   NA 142 06/04/2017 1205   K 4.5 06/04/2017 1205   CL 105 06/04/2017 1205   CO2 25 06/04/2017 1205   GLUCOSE 81 06/04/2017 1205   GLUCOSE 105 (H) 12/12/2014 1724   BUN 12 06/04/2017 1205   CREATININE 0.71 06/04/2017 1205   CALCIUM 9.3 06/04/2017 1205   GFRNONAA 105 06/04/2017 1205   GFRAA 121 06/04/2017 1205   Lab Results  Component Value Date   HGBA1C 5.9 (H) 02/06/2018   HGBA1C 5.8 (H) 06/04/2017   Lab Results  Component Value Date   INSULIN 28.5 (H) 06/04/2017   CBC    Component Value Date/Time   WBC 5.4 06/04/2017 1205   WBC 8.9 06/29/2008 0430   RBC 4.49 06/04/2017 1205   RBC 4.28 06/29/2008 0430   HGB 13.2 06/04/2017 1205   HCT 40.6 06/04/2017 1205   PLT 332 06/29/2008 0430   MCV 90 06/04/2017 1205   MCH 29.4 06/04/2017 1205   MCHC 32.5 06/04/2017 1205   MCHC 33.9 06/29/2008 0430   RDW 13.2 06/04/2017 1205   LYMPHSABS 2.6 06/04/2017 1205   MONOABS 0.5 06/29/2008 0430   EOSABS 0.2 06/04/2017 1205   BASOSABS 0.0 06/04/2017 1205   Iron/TIBC/Ferritin/ %Sat No results found for: IRON, TIBC, FERRITIN, IRONPCTSAT Lipid Panel     Component Value Date/Time   CHOL 163 06/04/2017 1205   TRIG 63 06/04/2017 1205   HDL 46 06/04/2017 1205   LDLCALC 104 (H) 06/04/2017 1205   Hepatic Function Panel     Component Value Date/Time   PROT 7.0 06/04/2017 1205   ALBUMIN 4.6 06/04/2017 1205   AST 17 06/04/2017 1205   ALT 14 06/04/2017 1205   ALKPHOS 78 06/04/2017 1205   BILITOT <0.2 06/04/2017 1205      Component Value Date/Time     TSH 1.550 06/04/2017 1205  Ref. Range 02/06/2018 11:25  Vitamin D, 25-Hydroxy Latest Ref Range: 30.0 - 100.0 ng/mL 12.8 (L)     OBESITY BEHAVIORAL INTERVENTION VISIT  Today's visit was # 8   Starting weight: 332 lbs Starting date: 06/04/2017 Today's weight : 337 lbs Today's date: 03/25/2018 Total lbs lost to date: 0   ASK: We discussed the diagnosis of obesity with Hardie Shackleton Heider today and Kitrina agreed to give [name] permission to discuss obesity behavioral modification therapy today.  ASSESS: Luvern has the diagnosis of obesity and her BMI today is 54.42 Joey is in the action stage of change   ADVISE: Briseidy was educated on the multiple health risks of obesity as well as the benefit of weight loss to improve her health. She was advised of the need for long term treatment and the importance of lifestyle modifications to improve her current health and to decrease her risk of future health problems.  AGREE: Multiple dietary modification options and treatment options were discussed and  Jessica Yoder agreed to follow the recommendations documented in the above note.  ARRANGE: Jessica Yoder was educated on the importance of frequent visits to treat obesity as outlined per CMS and USPSTF guidelines and agreed to schedule her next follow up appointment today.  Cristi Loron, am acting as Energy manager for [employer]. Manson Passey, DO  I have reviewed the above documentation for accuracy and completeness, and I agree with the above. -Corinna Capra, DO

## 2018-04-10 ENCOUNTER — Ambulatory Visit (INDEPENDENT_AMBULATORY_CARE_PROVIDER_SITE_OTHER): Payer: 59 | Admitting: Family Medicine

## 2018-04-10 ENCOUNTER — Encounter (INDEPENDENT_AMBULATORY_CARE_PROVIDER_SITE_OTHER): Payer: Self-pay

## 2018-04-15 ENCOUNTER — Encounter (INDEPENDENT_AMBULATORY_CARE_PROVIDER_SITE_OTHER): Payer: Self-pay | Admitting: Bariatrics

## 2018-04-17 ENCOUNTER — Encounter (INDEPENDENT_AMBULATORY_CARE_PROVIDER_SITE_OTHER): Payer: Self-pay

## 2018-04-24 ENCOUNTER — Other Ambulatory Visit: Payer: Self-pay

## 2018-04-24 ENCOUNTER — Encounter (INDEPENDENT_AMBULATORY_CARE_PROVIDER_SITE_OTHER): Payer: Self-pay | Admitting: Family Medicine

## 2018-04-24 ENCOUNTER — Ambulatory Visit (INDEPENDENT_AMBULATORY_CARE_PROVIDER_SITE_OTHER): Payer: 59 | Admitting: Family Medicine

## 2018-04-24 DIAGNOSIS — E559 Vitamin D deficiency, unspecified: Secondary | ICD-10-CM | POA: Diagnosis not present

## 2018-04-24 DIAGNOSIS — R7303 Prediabetes: Secondary | ICD-10-CM

## 2018-04-24 DIAGNOSIS — F3289 Other specified depressive episodes: Secondary | ICD-10-CM

## 2018-04-24 DIAGNOSIS — Z6841 Body Mass Index (BMI) 40.0 and over, adult: Secondary | ICD-10-CM

## 2018-04-24 MED ORDER — METFORMIN HCL 500 MG PO TABS: 500.0000 mg | ORAL_TABLET | Freq: Two times a day (BID) | ORAL | 0 refills | Status: DC

## 2018-04-24 MED ORDER — VITAMIN D (ERGOCALCIFEROL) 1.25 MG (50000 UNIT) PO CAPS: 50000.0000 [IU] | ORAL_CAPSULE | ORAL | 0 refills | Status: DC

## 2018-04-24 MED ORDER — BUPROPION HCL ER (SR) 200 MG PO TB12: 200.0000 mg | ORAL_TABLET | Freq: Every day | ORAL | 0 refills | Status: DC

## 2018-04-24 NOTE — Progress Notes (Signed)
Office: [phone]  /  Fax: [phone] TeleHealth Visit:  Jessica Yoder has verbally consented to this TeleHealth visit today. The patient is located at home, the provider is located at the [facility] office. The participants in this visit include the listed provider and patient. The visit was conducted today via Face Time.  HPI:   Chief Complaint: OBESITY Jessica Yoder is here to discuss her progress with her obesity treatment plan. She is on the Category 3 plan and is following her eating plan approximately 50 % of the time. She states she is walking 30 minutes 4 times per week. [name] has been struggling to eat all of her food on her Category 3 plan, due to grocery shortages. She feels that she has been able to maintain her weight during this time. She notes that she is still struggling with emotional, stress, and boredom eating.  We were unable to weigh the patient today for this TeleHealth visit. She feels as if she has maintained weight since her last visit. She has lost 0 lbs since starting treatment with [name].  Depression with emotional eating behaviors [name] was started on Wellbutrin and feels that it is helping her mood and emotional eating, but she is still pretty stressed and feels a higher dose would be helpful. She is struggling with emotional eating and using food for comfort to the extent that it is negatively impacting her health. She often snacks when she is not hungry. Anorah sometimes feels she is out of control and then feels guilty that she made poor food choices. She has been working on behavior modification techniques to help reduce her emotional eating and has been somewhat successful.   Vitamin D Deficiency [name] has a diagnosis of vitamin D deficiency. She is currently stable on vit D, but is not yet at goal. Jamarria denies nausea, vomiting, or muscle weakness.  Pre-Diabetes [name] has a diagnosis of pre-diabetes based on her elevated Hgb A1c and was  informed this puts her at greater risk of developing diabetes. She is stable on metformin currently and is working on her eating, but has been struggling more since [employer]. She notes decreased polyphagia.  ASSESSMENT AND PLAN:  Vitamin D deficiency - Plan: Vitamin D, Ergocalciferol, (DRISDOL) 1.25 MG (50000 UT) CAPS capsule  Prediabetes - Plan: metFORMIN (GLUCOPHAGE) 500 MG tablet  Other depression - emotional eating - Plan: buPROPion (WELLBUTRIN SR) 200 MG 12 hr tablet  Class 3 severe obesity with serious comorbidity and body mass index (BMI) of 50.0 to 59.9 in adult, unspecified obesity type (HCC)  PLAN:  Vitamin D Deficiency [name] was informed that low vitamin D levels contribute to fatigue and are associated with obesity, breast, and colon cancer. [name] agrees to continue to take prescription Vit D @50 ,000 IU every week #4 with no refills and will follow up for routine testing of vitamin D, at least 2-3 times per year. She was informed of the risk of over-replacement of vitamin D and agrees to not increase her dose unless she discusses this with [name] first. Sissy agrees to follow up in 2 weeks as directed.  Pre-Diabetes [name] will continue to work on weight loss, exercise, and decreasing simple carbohydrates in her diet to help decrease the risk of diabetes. She was informed that eating too many simple carbohydrates or too many calories at one sitting increases the likelihood of GI side effects. [name] agreed to continue her diet, exercise, and taking metformin 500 mg BID #60 with no  refills and a prescription was written today. [name] agreed to follow up with [name] as directed to monitor her progress in 2 weeks.    Depression with Emotional Eating Behaviors We discussed behavior modification techniques today to help [name] deal with her emotional eating and depression. She has agreed to increase Wellbutrin SR to 200 mg qd #30 with no refills and agreed to follow up as directed.   Obesity [name] is currently in the action stage of change. As such, her goal is to continue with weight loss efforts. She has agreed to follow the Category 3 plan. [name] has been instructed to work up to a goal of 150 minutes of combined cardio and strengthening exercise per week for weight loss and overall health benefits. We discussed the following Behavioral Modification Strategies today: work on meal planning and easy cooking plans, ways to avoid boredom eating, no skipping meals, keeping healthy foods in the home, and ways to avoid night time snacking.  [name] has agreed to follow up with our clinic in 2 weeks. She was informed of the importance of frequent follow up visits to maximize her success with intensive lifestyle modifications for her multiple health conditions.  ALLERGIES: Allergies  Allergen Reactions  . Amitriptyline Rash  . Latex Rash  . Saxenda [Liraglutide -Weight Management] Itching    Injection site    MEDICATIONS: Current Outpatient Medications on File Prior to Visit  Medication Sig Dispense Refill  . albuterol (PROVENTIL HFA;VENTOLIN HFA) 108 (90 Base) MCG/ACT inhaler Inhale 2 puffs into the lungs every 6 (six) hours as needed for wheezing or shortness of breath. 1 Inhaler 0  . benzonatate (TESSALON) 100 MG capsule Take 1-2 capsules (100-200 mg total) by mouth 3 (three) times daily. 30 capsule 0  . buPROPion (WELLBUTRIN SR) 150 MG 12 hr tablet Take 1 tablet (150 mg total) by mouth daily. 30 tablet 0  . levonorgestrel (MIRENA) 20 MCG/24HR IUD 1 each by Intrauterine route once.    . metFORMIN (GLUCOPHAGE) 500 MG tablet Take 1 tablet (500 mg total) by mouth 2 (two) times daily with a meal. 60 tablet 0  . Vitamin D, Ergocalciferol, (DRISDOL) 1.25 MG (50000 UT) CAPS capsule Take 1 capsule (50,000 Units total) by mouth every 7 (seven) days. 24 capsule 1   No current facility-administered medications on file prior to visit.     PAST MEDICAL HISTORY: Past Medical  History:  Diagnosis Date  . Allergy   . Elevated hemoglobin A1c   . Migraine   . Vitamin D deficiency     PAST SURGICAL HISTORY: Past Surgical History:  Procedure Laterality Date  . EYE SURGERY  2011   Lasik  . LAPAROSCOPIC GASTRIC BANDING  06/28/2008    SOCIAL HISTORY: Social History   Tobacco Use  . Smoking status: Never Smoker  . Smokeless tobacco: Never Used  Substance Use Topics  . Alcohol use: Yes    Comment: socially  . Drug use: No    FAMILY HISTORY: Family History  Problem Relation Age of Onset  . Other Father        ETOH Abuse  . Thyroid disease Mother   . Obesity Mother   . Cancer Other   . Hyperlipidemia Other   . Stroke Other   . Diabetes Other   . Obesity Other   . Sleep apnea Other     ROS: Review of Systems  Gastrointestinal: Negative for nausea and vomiting.  Musculoskeletal:       Negative for muscle weakness.  Endo/Heme/Allergies:       Positive for polyphagia.  Psychiatric/Behavioral: Positive for depression.    PHYSICAL EXAM: Pt in no acute distress  RECENT LABS AND TESTS: BMET    Component Value Date/Time   NA 142 06/04/2017 1205   K 4.5 06/04/2017 1205   CL 105 06/04/2017 1205   CO2 25 06/04/2017 1205   GLUCOSE 81 06/04/2017 1205   GLUCOSE 105 (H) 12/12/2014 1724   BUN 12 06/04/2017 1205   CREATININE 0.71 06/04/2017 1205   CALCIUM 9.3 06/04/2017 1205   GFRNONAA 105 06/04/2017 1205   GFRAA 121 06/04/2017 1205   Lab Results  Component Value Date   HGBA1C 5.9 (H) 02/06/2018   HGBA1C 5.8 (H) 06/04/2017   Lab Results  Component Value Date   INSULIN 28.5 (H) 06/04/2017   CBC    Component Value Date/Time   WBC 5.4 06/04/2017 1205   WBC 8.9 06/29/2008 0430   RBC 4.49 06/04/2017 1205   RBC 4.28 06/29/2008 0430   HGB 13.2 06/04/2017 1205   HCT 40.6 06/04/2017 1205   PLT 332 06/29/2008 0430   MCV 90 06/04/2017 1205   MCH 29.4 06/04/2017 1205   MCHC 32.5 06/04/2017 1205   MCHC 33.9 06/29/2008 0430   RDW 13.2  06/04/2017 1205   LYMPHSABS 2.6 06/04/2017 1205   MONOABS 0.5 06/29/2008 0430   EOSABS 0.2 06/04/2017 1205   BASOSABS 0.0 06/04/2017 1205   Iron/TIBC/Ferritin/ %Sat No results found for: IRON, TIBC, FERRITIN, IRONPCTSAT Lipid Panel     Component Value Date/Time   CHOL 163 06/04/2017 1205   TRIG 63 06/04/2017 1205   HDL 46 06/04/2017 1205   LDLCALC 104 (H) 06/04/2017 1205   Hepatic Function Panel     Component Value Date/Time   PROT 7.0 06/04/2017 1205   ALBUMIN 4.6 06/04/2017 1205   AST 17 06/04/2017 1205   ALT 14 06/04/2017 1205   ALKPHOS 78 06/04/2017 1205   BILITOT <0.2 06/04/2017 1205      Component Value Date/Time   TSH 1.550 06/04/2017 1205   Results for AARIONA, MOMON (MRN [id number]) as of 04/24/2018 11:59  Ref. Range 02/06/2018 11:25  Vitamin D, 25-Hydroxy Latest Ref Range: 30.0 - 100.0 ng/mL 12.8 (L)     I, Kirke Corin, CMA, am acting as transcriptionist for Wilder Glade, MD I have reviewed the above documentation for accuracy and completeness, and I agree with the above. -Quillian Quince, MD

## 2018-04-28 DIAGNOSIS — M25562 Pain in left knee: Secondary | ICD-10-CM | POA: Diagnosis not present

## 2018-04-28 DIAGNOSIS — M25362 Other instability, left knee: Secondary | ICD-10-CM | POA: Diagnosis not present

## 2018-04-29 ENCOUNTER — Encounter (INDEPENDENT_AMBULATORY_CARE_PROVIDER_SITE_OTHER): Payer: Self-pay | Admitting: Family Medicine

## 2018-05-01 MED FILL — VIT D2 1.25 MG (50,000 UNIT: 1.25 MG | 28 days supply | Qty: 4 | Fill #0

## 2018-05-01 MED FILL — metFORMIN HCL 500 MG TABS: 500 | 30 days supply | Qty: 60 | Fill #0

## 2018-05-01 MED FILL — BUPROPION HCL SR 200 MG TAB: 200 | 30 days supply | Qty: 30 | Fill #0

## 2018-05-08 ENCOUNTER — Encounter (INDEPENDENT_AMBULATORY_CARE_PROVIDER_SITE_OTHER): Payer: Self-pay | Admitting: Bariatrics

## 2018-05-08 ENCOUNTER — Other Ambulatory Visit: Payer: Self-pay

## 2018-05-08 ENCOUNTER — Ambulatory Visit (INDEPENDENT_AMBULATORY_CARE_PROVIDER_SITE_OTHER): Payer: 59 | Admitting: Bariatrics

## 2018-05-08 DIAGNOSIS — E559 Vitamin D deficiency, unspecified: Secondary | ICD-10-CM

## 2018-05-08 DIAGNOSIS — R7303 Prediabetes: Secondary | ICD-10-CM

## 2018-05-08 DIAGNOSIS — Z6841 Body Mass Index (BMI) 40.0 and over, adult: Secondary | ICD-10-CM | POA: Diagnosis not present

## 2018-05-08 DIAGNOSIS — F3289 Other specified depressive episodes: Secondary | ICD-10-CM | POA: Diagnosis not present

## 2018-05-12 NOTE — Progress Notes (Signed)
Office: [phone]  /  Fax: [phone] TeleHealth Visit:  Jessica Yoder has verbally consented to this TeleHealth visit today. The patient is located at home, the provider is located at the [facility] office. The participants in this visit include the listed provider and patient and any and all parties involved. The visit was conducted today via FaceTime.  HPI:   Chief Complaint: OBESITY Jessica Yoder is here to discuss her progress with her obesity treatment plan. She is on the Category 3 plan and is following her eating plan approximately 70 % of the time. She states she is walking 30 minutes 3 to 4 times per week. Jessica Yoder states that she has lost 2 to 3 pounds. She has had to eat out more. We were unable to weigh the patient today for this TeleHealth visit. She feels as if she has lost weight since her last visit.   Vitamin D deficiency Jessica Yoder has a diagnosis of vitamin D deficiency. She is taking high dose vit D and denies nausea, vomiting or muscle weakness.  Depression with emotional eating behaviors Jessica Yoder struggles with emotional eating and using food for comfort to the extent that it is negatively impacting her health. She often snacks when she is not hungry. Jessica Yoder sometimes feels she is out of control and then feels guilty that she made poor food choices. She is taking Wellbutrin. Wellbutrin is working well and is better at 200 mg. She has been working on behavior modification techniques to help reduce her emotional eating and has been somewhat successful. She shows no sign of suicidal or homicidal ideations.  Pre-Diabetes [name] has a diagnosis of prediabetes based on her elevated Hgb A1c and was informed this puts her at greater risk of developing diabetes. Her last A1c was at 5.9 and last insulin level was at 28.5 She is taking metformin currently and continues to work on diet and exercise to decrease risk of diabetes. She denies nausea or hypoglycemia.  ASSESSMENT  AND PLAN:  Vitamin D deficiency  Prediabetes  Other depression - with emotional eating  Class 3 severe obesity with serious comorbidity and body mass index (BMI) of 50.0 to 59.9 in adult, unspecified obesity type (HCC)  PLAN:  Vitamin D Deficiency [name] was informed that low vitamin D levels contributes to fatigue and are associated with obesity, breast, and colon cancer. She will continue to take prescription Vit D ,000 IU every week and will follow up for routine testing of vitamin D, at least 2-3 times per year. She was informed of the risk of over-replacement of vitamin D and agrees to not increase her dose unless she discusses this with [name] first.  Depression with Emotional Eating Behaviors We discussed behavior modification techniques today to help [name] deal with her emotional eating and depression. She will continue Wellbutrin SR 200 mg qd and follow up as directed.  Pre-Diabetes Jessica Yoder will continue to work on weight loss, exercise, and decreasing simple carbohydrates in her diet to help decrease the risk of diabetes. We dicussed metformin including benefits and risks. She was informed that eating too many simple carbohydrates or too many calories at one sitting increases the likelihood of GI side effects. Jessica Yoder will continue metformin for now and a prescription was not written today. Ondine agreed to follow up with [name] as directed to monitor her progress.  Obesity Jessica Yoder is currently in the action stage of change. As such, her goal is to continue with weight loss efforts She has agreed to  follow the Category 3 plan Jessica Yoder will continue exercise for weight loss and overall health benefits and she will start biking. We discussed the following Behavioral Modification Strategies today: increase H2O intake (download drink water), no skipping meals, keeping healthy foods in the home, increasing lean protein intake, decreasing simple carbohydrates, increasing vegetables, decrease  eating out and work on meal planning and easy cooking plans Jessica Yoder will weigh herself at home before each visit.  Bill has agreed to follow up with our clinic in 2 weeks. She was informed of the importance of frequent follow up visits to maximize her success with intensive lifestyle modifications for her multiple health conditions.  ALLERGIES: Allergies  Allergen Reactions  . Amitriptyline Rash  . Latex Rash  . Saxenda [Liraglutide -Weight Management] Itching    Injection site    MEDICATIONS: Current Outpatient Medications on File Prior to Visit  Medication Sig Dispense Refill  . albuterol (PROVENTIL HFA;VENTOLIN HFA) 108 (90 Base) MCG/ACT inhaler Inhale 2 puffs into the lungs every 6 (six) hours as needed for wheezing or shortness of breath. 1 Inhaler 0  . benzonatate (TESSALON) 100 MG capsule Take 1-2 capsules (100-200 mg total) by mouth 3 (three) times daily. 30 capsule 0  . buPROPion (WELLBUTRIN SR) 200 MG 12 hr tablet Take 1 tablet (200 mg total) by mouth daily. 30 tablet 0  . levonorgestrel (MIRENA) 20 MCG/24HR IUD 1 each by Intrauterine route once.    . metFORMIN (GLUCOPHAGE) 500 MG tablet Take 1 tablet (500 mg total) by mouth 2 (two) times daily with a meal. 60 tablet 0  . Vitamin D, Ergocalciferol, (DRISDOL) 1.25 MG (50000 UT) CAPS capsule Take 1 capsule (50,000 Units total) by mouth every 7 (seven) days. 4 capsule 0   No current facility-administered medications on file prior to visit.     PAST MEDICAL HISTORY: Past Medical History:  Diagnosis Date  . Allergy   . Elevated hemoglobin A1c   . Migraine   . Vitamin D deficiency     PAST SURGICAL HISTORY: Past Surgical History:  Procedure Laterality Date  . EYE SURGERY  2011   Lasik  . LAPAROSCOPIC GASTRIC BANDING  06/28/2008    SOCIAL HISTORY: Social History   Tobacco Use  . Smoking status: Never Smoker  . Smokeless tobacco: Never Used  Substance Use Topics  . Alcohol use: Yes    Comment: socially  . Drug  use: No    FAMILY HISTORY: Family History  Problem Relation Age of Onset  . Other Father        ETOH Abuse  . Thyroid disease Mother   . Obesity Mother   . Cancer Other   . Hyperlipidemia Other   . Stroke Other   . Diabetes Other   . Obesity Other   . Sleep apnea Other     ROS: Review of Systems  Constitutional: Positive for weight loss.  Gastrointestinal: Negative for nausea and vomiting.  Musculoskeletal:       Negative for muscle weakness  Endo/Heme/Allergies:       Negative for hypoglycemia  Psychiatric/Behavioral: Positive for depression. Negative for suicidal ideas.    PHYSICAL EXAM: Pt in no acute distress  RECENT LABS AND TESTS: BMET    Component Value Date/Time   NA 142 06/04/2017 1205   K 4.5 06/04/2017 1205   CL 105 06/04/2017 1205   CO2 25 06/04/2017 1205   GLUCOSE 81 06/04/2017 1205   GLUCOSE 105 (H) 12/12/2014 1724   BUN 12 06/04/2017 1205  CREATININE 0.71 06/04/2017 1205   CALCIUM 9.3 06/04/2017 1205   GFRNONAA 105 06/04/2017 1205   GFRAA 121 06/04/2017 1205   Lab Results  Component Value Date   HGBA1C 5.9 (H) 02/06/2018   HGBA1C 5.8 (H) 06/04/2017   Lab Results  Component Value Date   INSULIN 28.5 (H) 06/04/2017   CBC    Component Value Date/Time   WBC 5.4 06/04/2017 1205   WBC 8.9 06/29/2008 0430   RBC 4.49 06/04/2017 1205   RBC 4.28 06/29/2008 0430   HGB 13.2 06/04/2017 1205   HCT 40.6 06/04/2017 1205   PLT 332 06/29/2008 0430   MCV 90 06/04/2017 1205   MCH 29.4 06/04/2017 1205   MCHC 32.5 06/04/2017 1205   MCHC 33.9 06/29/2008 0430   RDW 13.2 06/04/2017 1205   LYMPHSABS 2.6 06/04/2017 1205   MONOABS 0.5 06/29/2008 0430   EOSABS 0.2 06/04/2017 1205   BASOSABS 0.0 06/04/2017 1205   Iron/TIBC/Ferritin/ %Sat No results found for: IRON, TIBC, FERRITIN, IRONPCTSAT Lipid Panel     Component Value Date/Time   CHOL 163 06/04/2017 1205   TRIG 63 06/04/2017 1205   HDL 46 06/04/2017 1205   LDLCALC 104 (H) 06/04/2017 1205    Hepatic Function Panel     Component Value Date/Time   PROT 7.0 06/04/2017 1205   ALBUMIN 4.6 06/04/2017 1205   AST 17 06/04/2017 1205   ALT 14 06/04/2017 1205   ALKPHOS 78 06/04/2017 1205   BILITOT <0.2 06/04/2017 1205      Component Value Date/Time   TSH 1.550 06/04/2017 1205     Ref. Range 02/06/2018 11:25  Vitamin D, 25-Hydroxy Latest Ref Range: 30.0 - 100.0 ng/mL 12.8 (L)    I, [place] CraneJoanne Murray, am acting as Energy managertranscriptionist for [place] Corporationngel A. Manson PasseyBrown, DO  I have reviewed the above documentation for accuracy and completeness, and I agree with the above. -Corinna CapraAngel Lynzy Rawles, DO

## 2018-05-13 DIAGNOSIS — M25562 Pain in left knee: Secondary | ICD-10-CM | POA: Diagnosis not present

## 2018-05-22 ENCOUNTER — Encounter (INDEPENDENT_AMBULATORY_CARE_PROVIDER_SITE_OTHER): Payer: Self-pay | Admitting: Bariatrics

## 2018-05-22 ENCOUNTER — Ambulatory Visit (INDEPENDENT_AMBULATORY_CARE_PROVIDER_SITE_OTHER): Payer: 59 | Admitting: Bariatrics

## 2018-05-22 ENCOUNTER — Other Ambulatory Visit: Payer: Self-pay

## 2018-05-22 DIAGNOSIS — E559 Vitamin D deficiency, unspecified: Secondary | ICD-10-CM | POA: Diagnosis not present

## 2018-05-22 DIAGNOSIS — Z6841 Body Mass Index (BMI) 40.0 and over, adult: Secondary | ICD-10-CM

## 2018-05-22 DIAGNOSIS — R7303 Prediabetes: Secondary | ICD-10-CM | POA: Diagnosis not present

## 2018-05-22 DIAGNOSIS — F3289 Other specified depressive episodes: Secondary | ICD-10-CM

## 2018-05-22 DIAGNOSIS — E66813 Obesity, class 3: Secondary | ICD-10-CM

## 2018-05-26 NOTE — Progress Notes (Signed)
Office: [phone]  /  Fax: [phone] TeleHealth Visit:  Jessica Yoder has verbally consented to this TeleHealth visit today. The patient is located in [place] (death in family), the provider is located at the [facility] office. The participants in this visit include the listed provider and patient and any and all parties involved. The visit was conducted today via FaceTime.  HPI:   Chief Complaint: OBESITY Jessica Yoder is here to discuss her progress with her obesity treatment plan. She is on the Category 3 plan and is following her eating plan approximately 75 to 80 % of the time. She states she is walking 30 minutes 4 times per week. Kynnedi states that her weight remains the same. She had been in [place] for almost two weeks, due to the death of her stepfather. Her appetite has been diminished and she has had no cravings. We were unable to weigh the patient today for this TeleHealth visit. She feels as if she has maintained weight since her last visit.   Vitamin D deficiency Jessica Yoder has a diagnosis of vitamin D deficiency. She is taking high dose vit D and denies nausea, vomiting or muscle weakness.  Pre-Diabetes Jessica Yoder has a diagnosis of prediabetes based on her elevated Hgb A1c and was informed this puts her at greater risk of developing diabetes. She is taking metformin currently and continues to work on diet and exercise to decrease risk of diabetes. She denies nausea or hypoglycemia.  Depression with emotional eating behaviors Jessica Yoder struggles with emotional eating and using food for comfort to the extent that it is negatively impacting her health. She often snacks when she is not hungry. Jessica Yoder sometimes feels she is out of control and then feels guilty that she made poor food choices. She is taking Wellbutrin and the medication is helping. She has been working on behavior modification techniques to help reduce her emotional eating and has been somewhat successful.  She shows no sign of suicidal or homicidal ideations.  Depression screen [facility] 2/9 02/06/2018 06/04/2017 03/23/2015 09/14/2014  Decreased Interest 0 1 0 0  Down, Depressed, Hopeless 0 1 0 0  PHQ - 2 Score 0 2 0 0  Altered sleeping - 0 - -  Tired, decreased energy - 2 - -  Change in appetite - 3 - -  Feeling bad or failure about yourself  - 2 - -  Trouble concentrating - 0 - -  Moving slowly or fidgety/restless - 0 - -  Suicidal thoughts - 0 - -  PHQ-9 Score - 9 - -  Difficult doing work/chores - Somewhat difficult - -    ASSESSMENT AND PLAN:  Vitamin D deficiency  Prediabetes  Other depression - with emotional eating  Class 3 severe obesity with serious comorbidity and body mass index (BMI) of 50.0 to 59.9 in adult, unspecified obesity type (HCC)  PLAN:  Vitamin D Deficiency Jessica Yoder was informed that low vitamin D levels contributes to fatigue and are associated with obesity, breast, and colon cancer. She will continue to take prescription Vit D @50 ,000 IU every week and will follow up for routine testing of vitamin D, at least 2-3 times per year. She was informed of the risk of over-replacement of vitamin D and agrees to not increase her dose unless she discusses this with [name] first.  Pre-Diabetes Jessica Yoder will continue to work on weight loss, exercise, and decreasing simple carbohydrates in her diet to help decrease the risk of diabetes. We dicussed metformin including benefits  and risks. She was informed that eating too many simple carbohydrates or too many calories at one sitting increases the likelihood of GI side effects. Jessica Yoder will continue metformin for now and a prescription was not written today. Jessica Yoder agreed to follow up with us as directed to monitor her progress.  Depression with Emotional Eating Behaviors We discussed behavior modification techniques today to help Jessica Yoder deal with her emotional eating and depression. She will continue to take Wellbutrin SR 200 mg qd and  agreed to follow up as directed.  Obesity Jessica Yoder is currently in the action stage of change. As such, her goal is to continue with weight loss efforts She has agreed to follow the Category 3 plan Jessica Yoder will continue her exercise regimen and she will start biking for weight loss and overall health benefits. We discussed the following Behavioral Modification Strategies today: increase H2O intake, no skipping meals, keeping healthy foods in the home, increasing lean protein intake, decreasing simple carbohydrates, increasing vegetables, decrease eating out and work on meal planning and easy cooking plans Jessica Yoder will weigh herself at home before each visit.  Jessica Yoder has agreed to follow up with our clinic in 2 weeks. She was informed of the importance of frequent follow up visits to maximize her success with intensive lifestyle modifications for her multiple health conditions.  ALLERGIES: Allergies  Allergen Reactions  . Amitriptyline Rash  . Latex Rash  . Saxenda [Liraglutide -Weight Management] Itching    Injection site    MEDICATIONS: Current Outpatient Medications on File Prior to Visit  Medication Sig Dispense Refill  . albuterol (PROVENTIL HFA;VENTOLIN HFA) 108 (90 Base) MCG/ACT inhaler Inhale 2 puffs into the lungs every 6 (six) hours as needed for wheezing or shortness of breath. 1 Inhaler 0  . benzonatate (TESSALON) 100 MG capsule Take 1-2 capsules (100-200 mg total) by mouth 3 (three) times daily. 30 capsule 0  . buPROPion (WELLBUTRIN SR) 200 MG 12 hr tablet Take 1 tablet (200 mg total) by mouth daily. 30 tablet 0  . levonorgestrel (MIRENA) 20 MCG/24HR IUD 1 each by Intrauterine route once.    . metFORMIN (GLUCOPHAGE) 500 MG tablet Take 1 tablet (500 mg total) by mouth 2 (two) times daily with a meal. 60 tablet 0  . Vitamin D, Ergocalciferol, (DRISDOL) 1.25 MG (50000 UT) CAPS capsule Take 1 capsule (50,000 Units total) by mouth every 7 (seven) days. 4 capsule 0   No current  facility-administered medications on file prior to visit.     PAST MEDICAL HISTORY: Past Medical History:  Diagnosis Date  . Allergy   . Elevated hemoglobin A1c   . Migraine   . Vitamin D deficiency     PAST SURGICAL HISTORY: Past Surgical History:  Procedure Laterality Date  . EYE SURGERY  2011   Lasik  . LAPAROSCOPIC GASTRIC BANDING  06/28/2008    SOCIAL HISTORY: Social History   Tobacco Use  . Smoking status: Never Smoker  . Smokeless tobacco: Never Used  Substance Use Topics  . Alcohol use: Yes    Comment: socially  . Drug use: No    FAMILY HISTORY: Family History  Problem Relation Age of Onset  . Other Father        ETOH Abuse  . Thyroid disease Mother   . Obesity Mother   . Cancer Other   . Hyperlipidemia Other   . Stroke Other   . Diabetes Other   . Obesity Other   . Sleep apnea Other  ROS: Review of Systems  Constitutional: Negative for weight loss.  Gastrointestinal: Negative for nausea and vomiting.  Musculoskeletal:       Negative for muscle weakness  Endo/Heme/Allergies:       Negative for hypoglycemia  Psychiatric/Behavioral: Positive for depression. Negative for suicidal ideas.    PHYSICAL EXAM: Pt in no acute distress  RECENT LABS AND TESTS: BMET    Component Value Date/Time   NA 142 06/04/2017 1205   K 4.5 06/04/2017 1205   CL 105 06/04/2017 1205   CO2 25 06/04/2017 1205   GLUCOSE 81 06/04/2017 1205   GLUCOSE 105 (H) 12/12/2014 1724   BUN 12 06/04/2017 1205   CREATININE 0.71 06/04/2017 1205   CALCIUM 9.3 06/04/2017 1205   GFRNONAA 105 06/04/2017 1205   GFRAA 121 06/04/2017 1205   Lab Results  Component Value Date   HGBA1C 5.9 (H) 02/06/2018   HGBA1C 5.8 (H) 06/04/2017   Lab Results  Component Value Date   INSULIN 28.5 (H) 06/04/2017   CBC    Component Value Date/Time   WBC 5.4 06/04/2017 1205   WBC 8.9 06/29/2008 0430   RBC 4.49 06/04/2017 1205   RBC 4.28 06/29/2008 0430   HGB 13.2 06/04/2017 1205   HCT  40.6 06/04/2017 1205   PLT 332 06/29/2008 0430   MCV 90 06/04/2017 1205   MCH 29.4 06/04/2017 1205   MCHC 32.5 06/04/2017 1205   MCHC 33.9 06/29/2008 0430   RDW 13.2 06/04/2017 1205   LYMPHSABS 2.6 06/04/2017 1205   MONOABS 0.5 06/29/2008 0430   EOSABS 0.2 06/04/2017 1205   BASOSABS 0.0 06/04/2017 1205   Iron/TIBC/Ferritin/ %Sat No results found for: IRON, TIBC, FERRITIN, IRONPCTSAT Lipid Panel     Component Value Date/Time   CHOL 163 06/04/2017 1205   TRIG 63 06/04/2017 1205   HDL 46 06/04/2017 1205   LDLCALC 104 (H) 06/04/2017 1205   Hepatic Function Panel     Component Value Date/Time   PROT 7.0 06/04/2017 1205   ALBUMIN 4.6 06/04/2017 1205   AST 17 06/04/2017 1205   ALT 14 06/04/2017 1205   ALKPHOS 78 06/04/2017 1205   BILITOT <0.2 06/04/2017 1205      Component Value Date/Time   TSH 1.550 06/04/2017 1205     Ref. Range 02/06/2018 11:25  Vitamin D, 25-Hydroxy Latest Ref Range: 30.0 - 100.0 ng/mL 12.8 (L)    I, [place] Crane, am acting as Energy manager for [employer]. Manson Passey, DO  I have reviewed the above documentation for accuracy and completeness, and I agree with the above. -Corinna Capra, DO

## 2018-06-05 ENCOUNTER — Ambulatory Visit (INDEPENDENT_AMBULATORY_CARE_PROVIDER_SITE_OTHER): Payer: 59 | Admitting: Bariatrics

## 2018-06-05 ENCOUNTER — Other Ambulatory Visit: Payer: Self-pay

## 2018-06-05 DIAGNOSIS — F3289 Other specified depressive episodes: Secondary | ICD-10-CM

## 2018-06-05 DIAGNOSIS — Z6841 Body Mass Index (BMI) 40.0 and over, adult: Secondary | ICD-10-CM | POA: Diagnosis not present

## 2018-06-05 DIAGNOSIS — E559 Vitamin D deficiency, unspecified: Secondary | ICD-10-CM | POA: Diagnosis not present

## 2018-06-05 DIAGNOSIS — R7303 Prediabetes: Secondary | ICD-10-CM | POA: Diagnosis not present

## 2018-06-05 MED ORDER — BUPROPION HCL ER (SR) 200 MG PO TB12: 200.0000 mg | ORAL_TABLET | Freq: Every day | ORAL | 0 refills | Status: DC

## 2018-06-05 MED FILL — BUPROPION HCL SR 200 MG TAB: 200 | 30 days supply | Qty: 30 | Fill #0

## 2018-06-09 ENCOUNTER — Encounter (INDEPENDENT_AMBULATORY_CARE_PROVIDER_SITE_OTHER): Payer: Self-pay

## 2018-06-09 ENCOUNTER — Encounter (INDEPENDENT_AMBULATORY_CARE_PROVIDER_SITE_OTHER): Payer: Self-pay | Admitting: Bariatrics

## 2018-06-09 NOTE — Progress Notes (Signed)
Office: [phone]  /  Fax: [phone] TeleHealth Visit:  Jessica Yoder has verbally consented to this TeleHealth visit today. The patient is located at home, the provider is located at the [facility] office. The participants in this visit include the listed provider and patient and any and all parties involved. The visit was conducted today via FaceTime.  HPI:   Chief Complaint: OBESITY Jessica Yoder is here to discuss her progress with her obesity treatment plan. She is on the Category 3 plan and is following her eating plan approximately 70 % of the time. She states she is walking 30 minutes 3 to 4 times per week. [name] states that she knows that she has lost weight, but she is unsure of the amount. She states that her appetite is diminished. We were unable to weigh the patient today for this TeleHealth visit. She feels as if she has lost weight since her last visit.  Vitamin D deficiency Jessica Yoder has a diagnosis of vitamin D deficiency. She is currently taking vit D and denies nausea, vomiting or muscle weakness.  Pre-Diabetes Jessica Yoder has a diagnosis of prediabetes and she was informed this puts her at greater risk of developing diabetes. There are no recent labs. She is not taking medications currently and continues to work on diet and exercise to decrease risk of diabetes. She denies nausea or hypoglycemia.  Depression with emotional eating behaviors Jessica Yoder is struggling with emotional eating and using food for comfort to the extent that it is negatively impacting her health. She often snacks when she is not hungry. Jessica Yoder sometimes feels she is out of control and then feels guilty that she made poor food choices. She has been working on behavior modification techniques to help reduce her emotional eating and has been somewhat successful. She shows no sign of suicidal or homicidal ideations.  Depression screen [facility] 2/9 02/06/2018 06/04/2017 03/23/2015 09/14/2014  Decreased  Interest 0 1 0 0  Down, Depressed, Hopeless 0 1 0 0  PHQ - 2 Score 0 2 0 0  Altered sleeping - 0 - -  Tired, decreased energy - 2 - -  Change in appetite - 3 - -  Feeling bad or failure about yourself  - 2 - -  Trouble concentrating - 0 - -  Moving slowly or fidgety/restless - 0 - -  Suicidal thoughts - 0 - -  PHQ-9 Score - 9 - -  Difficult doing work/chores - Somewhat difficult - -    ASSESSMENT AND PLAN:  Vitamin D deficiency - Plan: VITAMIN D 25 Hydroxy (Vit-D Deficiency, Fractures)  Prediabetes - Plan: Comprehensive metabolic panel, Hemoglobin A1c, Insulin, random, Lipid Panel With LDL/HDL Ratio  Other depression - with emotional eating  Class 3 severe obesity with serious comorbidity and body mass index (BMI) of 50.0 to 59.9 in adult, unspecified obesity type (HCC)  PLAN:  Vitamin D Deficiency Jessica Yoder was informed that low vitamin D levels contributes to fatigue and are associated with obesity, breast, and colon cancer. She agrees to continue prescription Vit D @50 ,000 IU every week #4 with no refills and will follow up for routine testing of vitamin D, at least 2-3 times per year. She was informed of the risk of over-replacement of vitamin D and agrees to not increase her dose unless she discusses this with [name] first. Jessica Yoder agrees to follow up with our clinic in 2 weeks.  Pre-Diabetes Jessica Yoder will continue to work on weight loss, exercise, increasing lean protein and decreasing  simple carbohydrates in her diet to help decrease the risk of diabetes. She was informed that eating too many simple carbohydrates or too many calories at one sitting increases the likelihood of GI side effects. Jessica Yoder agreed to follow up with us as directed to monitor her progress.  Depression with Emotional Eating Behaviors We discussed behavior modification techniques today to help Jessica Yoder deal with her emotional eating and depression. She has agreed to take Wellbutrin SR 200 mg daily #30 with no refills  and follow up as directed.  Obesity Jessica Yoder is currently in the action stage of change. As such, her goal is to continue with weight loss efforts She has agreed to follow the Category 3 plan Jessica Yoder will continue exercise (walking) for weight loss and overall health benefits. We discussed the following Behavioral Modification Strategies today: increase H2O intake, no skipping meals, keeping healthy foods in the home, increasing lean protein intake, decreasing simple carbohydrates, increasing vegetables, decrease eating out and work on meal planning and easy cooking plans Jessica Yoder will weigh herself at home and record before each visit.  Jessica Yoder has agreed to follow up with our clinic in 2 weeks. She was informed of the importance of frequent follow up visits to maximize her success with intensive lifestyle modifications for her multiple health conditions.  ALLERGIES: Allergies  Allergen Reactions  . Amitriptyline Rash  . Latex Rash  . Saxenda [Liraglutide -Weight Management] Itching    Injection site    MEDICATIONS: Current Outpatient Medications on File Prior to Visit  Medication Sig Dispense Refill  . albuterol (PROVENTIL HFA;VENTOLIN HFA) 108 (90 Base) MCG/ACT inhaler Inhale 2 puffs into the lungs every 6 (six) hours as needed for wheezing or shortness of breath. 1 Inhaler 0  . benzonatate (TESSALON) 100 MG capsule Take 1-2 capsules (100-200 mg total) by mouth 3 (three) times daily. 30 capsule 0  . levonorgestrel (MIRENA) 20 MCG/24HR IUD 1 each by Intrauterine route once.    . metFORMIN (GLUCOPHAGE) 500 MG tablet Take 1 tablet (500 mg total) by mouth 2 (two) times daily with a meal. 60 tablet 0  . Vitamin D, Ergocalciferol, (DRISDOL) 1.25 MG (50000 UT) CAPS capsule Take 1 capsule (50,000 Units total) by mouth every 7 (seven) days. 4 capsule 0   No current facility-administered medications on file prior to visit.     PAST MEDICAL HISTORY: Past Medical History:  Diagnosis Date  .  Allergy   . Elevated hemoglobin A1c   . Migraine   . Vitamin D deficiency     PAST SURGICAL HISTORY: Past Surgical History:  Procedure Laterality Date  . EYE SURGERY  2011   Lasik  . LAPAROSCOPIC GASTRIC BANDING  06/28/2008    SOCIAL HISTORY: Social History   Tobacco Use  . Smoking status: Never Smoker  . Smokeless tobacco: Never Used  Substance Use Topics  . Alcohol use: Yes    Comment: socially  . Drug use: No    FAMILY HISTORY: Family History  Problem Relation Age of Onset  . Other Father        ETOH Abuse  . Thyroid disease Mother   . Obesity Mother   . Cancer Other   . Hyperlipidemia Other   . Stroke Other   . Diabetes Other   . Obesity Other   . Sleep apnea Other     ROS: Review of Systems  Constitutional: Positive for weight loss.  Gastrointestinal: Negative for nausea and vomiting.  Musculoskeletal:       Negative for  muscle weakness  Endo/Heme/Allergies:       Negative for hypoglycemia  Psychiatric/Behavioral: Positive for depression. Negative for suicidal ideas.    PHYSICAL EXAM: Pt in no acute distress  RECENT LABS AND TESTS: BMET    Component Value Date/Time   NA 142 06/04/2017 1205   K 4.5 06/04/2017 1205   CL 105 06/04/2017 1205   CO2 25 06/04/2017 1205   GLUCOSE 81 06/04/2017 1205   GLUCOSE 105 (H) 12/12/2014 1724   BUN 12 06/04/2017 1205   CREATININE 0.71 06/04/2017 1205   CALCIUM 9.3 06/04/2017 1205   GFRNONAA 105 06/04/2017 1205   GFRAA 121 06/04/2017 1205   Lab Results  Component Value Date   HGBA1C 5.9 (H) 02/06/2018   HGBA1C 5.8 (H) 06/04/2017   Lab Results  Component Value Date   INSULIN 28.5 (H) 06/04/2017   CBC    Component Value Date/Time   WBC 5.4 06/04/2017 1205   WBC 8.9 06/29/2008 0430   RBC 4.49 06/04/2017 1205   RBC 4.28 06/29/2008 0430   HGB 13.2 06/04/2017 1205   HCT 40.6 06/04/2017 1205   PLT 332 06/29/2008 0430   MCV 90 06/04/2017 1205   MCH 29.4 06/04/2017 1205   MCHC 32.5 06/04/2017 1205    MCHC 33.9 06/29/2008 0430   RDW 13.2 06/04/2017 1205   LYMPHSABS 2.6 06/04/2017 1205   MONOABS 0.5 06/29/2008 0430   EOSABS 0.2 06/04/2017 1205   BASOSABS 0.0 06/04/2017 1205   Iron/TIBC/Ferritin/ %Sat No results found for: IRON, TIBC, FERRITIN, IRONPCTSAT Lipid Panel     Component Value Date/Time   CHOL 163 06/04/2017 1205   TRIG 63 06/04/2017 1205   HDL 46 06/04/2017 1205   LDLCALC 104 (H) 06/04/2017 1205   Hepatic Function Panel     Component Value Date/Time   PROT 7.0 06/04/2017 1205   ALBUMIN 4.6 06/04/2017 1205   AST 17 06/04/2017 1205   ALT 14 06/04/2017 1205   ALKPHOS 78 06/04/2017 1205   BILITOT <0.2 06/04/2017 1205      Component Value Date/Time   TSH 1.550 06/04/2017 1205     Ref. Range 02/06/2018 11:25  Vitamin D, 25-Hydroxy Latest Ref Range: 30.0 - 100.0 ng/mL 12.8 (L)    I, [place] Crane, am acting as Energy manager for [employer]. Manson Passey, DO  I have reviewed the above documentation for accuracy and completeness, and I agree with the above. -Corinna Capra, DO

## 2018-06-19 ENCOUNTER — Other Ambulatory Visit: Payer: Self-pay

## 2018-06-19 ENCOUNTER — Ambulatory Visit (INDEPENDENT_AMBULATORY_CARE_PROVIDER_SITE_OTHER): Payer: 59 | Admitting: Bariatrics

## 2018-06-19 ENCOUNTER — Encounter (INDEPENDENT_AMBULATORY_CARE_PROVIDER_SITE_OTHER): Payer: Self-pay | Admitting: Bariatrics

## 2018-06-19 DIAGNOSIS — R7303 Prediabetes: Secondary | ICD-10-CM | POA: Diagnosis not present

## 2018-06-19 DIAGNOSIS — Z6841 Body Mass Index (BMI) 40.0 and over, adult: Secondary | ICD-10-CM

## 2018-06-19 DIAGNOSIS — E559 Vitamin D deficiency, unspecified: Secondary | ICD-10-CM | POA: Diagnosis not present

## 2018-06-19 DIAGNOSIS — Z9884 Bariatric surgery status: Secondary | ICD-10-CM

## 2018-06-19 MED ORDER — VITAMIN D (ERGOCALCIFEROL) 1.25 MG (50000 UNIT) PO CAPS: 50000.0000 [IU] | ORAL_CAPSULE | ORAL | 0 refills | Status: DC

## 2018-06-19 MED ORDER — METFORMIN HCL 500 MG PO TABS: 500.0000 mg | ORAL_TABLET | Freq: Two times a day (BID) | ORAL | 0 refills | Status: DC

## 2018-06-19 NOTE — Progress Notes (Signed)
Office: [phone]  /  Fax: [phone] TeleHealth Visit:  Jessica Yoder has verbally consented to this TeleHealth visit today. The patient is located in her car, the provider is located at the [facility] office. The participants in this visit include the listed provider and patient and any and all parties involved. The visit was conducted today via FaceTime.  HPI:   Chief Complaint: OBESITY Jessica Yoder is here to discuss her progress with her obesity treatment plan. She is on the Category 3 plan and is following her eating plan approximately 75 % of the time. She states she is exercising 30 minutes 3 times per week. Jessica Yoder is unsure if she has lost weight. She does not think that she has gained weight. Jessica Yoder is getting in more water and protein. We were unable to weigh the patient today for this TeleHealth visit. She feels as if she has maintained weight since her last visit.   Vitamin D deficiency Jessica Yoder has a diagnosis of vitamin D deficiency. Her last vitamin D level was at 12.8 She is currently taking vit D and denies nausea, vomiting or muscle weakness.  Pre-Diabetes Jessica Yoder has a diagnosis of prediabetes based on her elevated Hgb A1c and was informed this puts her at greater risk of developing diabetes. Her last A1c was at 5.9 and last insulin level was at 28.5 Jessica Yoder is taking metformin currently and she continues to work on diet and exercise to decrease risk of diabetes. She denies polyphagia.  History of laparoscopic gastric banding  in 2010 She has some restriction. She denies nausea or vomiting.    ASSESSMENT AND PLAN:  Vitamin D deficiency - Plan: Vitamin D, Ergocalciferol, (DRISDOL) 1.25 MG (50000 UT) CAPS capsule  Prediabetes - Plan: metFORMIN (GLUCOPHAGE) 500 MG tablet  History of laparoscopic adjustable gastric banding  Class 3 severe obesity with serious comorbidity and body mass index (BMI) of 50.0 to 59.9 in adult, unspecified obesity type (HCC)   PLAN:  Vitamin D Deficiency Jessica Yoder was informed that low vitamin D levels contributes to fatigue and are associated with obesity, breast, and colon cancer. She agrees to continue to take prescription Vit D @50 ,000 IU every week #4 with no refills  and will follow up for routine testing of vitamin D, at least 2-3 times per year. She was informed of the risk of over-replacement of vitamin D and agrees to not increase her dose unless she discusses this with us first. Jessica Yoder agrees to follow up as directed.  Pre-Diabetes Jessica Yoder will continue to work on weight loss, exercise, and decreasing simple carbohydrates in her diet to help decrease the risk of diabetes. We dicussed metformin including benefits and risks. She was informed that eating too many simple carbohydrates or too many calories at one sitting increases the likelihood of GI side effects. Jessica Yoder agrees to continue Metformin 500 mg BID with a meal #60 with no refills and follow up with us as directed to monitor her progress.  History of laparoscopic gastric banding in 2010 Jessica Yoder will work on increasing lean protein in her diet and she will eat small, frequent meals.  Obesity Jessica Yoder is currently in the action stage of change. As such, her goal is to continue with weight loss efforts She has agreed to follow the Category 3 plan Jessica Yoder will continue exercise (biking, walking)  for weight loss and overall health benefits. We discussed the following Behavioral Modification Strategies today: increase H2O intake, no skipping meals, keeping healthy foods in the home,  increasing lean protein intake, decreasing simple carbohydrates, increasing vegetables, decrease eating out and work on meal planning and easy cooking plans Eren will continue to weigh herself at home and record.  Auriya has agreed to follow up with our clinic in 2 weeks. She was informed of the importance of frequent follow up visits to maximize her success with intensive lifestyle  modifications for her multiple health conditions.  ALLERGIES: Allergies  Allergen Reactions  . Amitriptyline Rash  . Latex Rash  . Saxenda [Liraglutide -Weight Management] Itching    Injection site    MEDICATIONS: Current Outpatient Medications on File Prior to Visit  Medication Sig Dispense Refill  . albuterol (PROVENTIL HFA;VENTOLIN HFA) 108 (90 Base) MCG/ACT inhaler Inhale 2 puffs into the lungs every 6 (six) hours as needed for wheezing or shortness of breath. 1 Inhaler 0  . benzonatate (TESSALON) 100 MG capsule Take 1-2 capsules (100-200 mg total) by mouth 3 (three) times daily. 30 capsule 0  . buPROPion (WELLBUTRIN SR) 200 MG 12 hr tablet Take 1 tablet (200 mg total) by mouth daily. 30 tablet 0  . levonorgestrel (MIRENA) 20 MCG/24HR IUD 1 each by Intrauterine route once.     No current facility-administered medications on file prior to visit.     PAST MEDICAL HISTORY: Past Medical History:  Diagnosis Date  . Allergy   . Elevated hemoglobin A1c   . Migraine   . Vitamin D deficiency     PAST SURGICAL HISTORY: Past Surgical History:  Procedure Laterality Date  . EYE SURGERY  2011   Lasik  . LAPAROSCOPIC GASTRIC BANDING  06/28/2008    SOCIAL HISTORY: Social History   Tobacco Use  . Smoking status: Never Smoker  . Smokeless tobacco: Never Used  Substance Use Topics  . Alcohol use: Yes    Comment: socially  . Drug use: No    FAMILY HISTORY: Family History  Problem Relation Age of Onset  . Other Father        ETOH Abuse  . Thyroid disease Mother   . Obesity Mother   . Cancer Other   . Hyperlipidemia Other   . Stroke Other   . Diabetes Other   . Obesity Other   . Sleep apnea Other     ROS: Review of Systems  Constitutional: Negative for weight loss.  Gastrointestinal: Negative for nausea and vomiting.  Musculoskeletal:       Negative for muscle weakness  Endo/Heme/Allergies:       Negative for polyphagia    PHYSICAL EXAM: Pt in no acute  distress  RECENT LABS AND TESTS: BMET    Component Value Date/Time   NA 142 06/04/2017 1205   K 4.5 06/04/2017 1205   CL 105 06/04/2017 1205   CO2 25 06/04/2017 1205   GLUCOSE 81 06/04/2017 1205   GLUCOSE 105 (H) 12/12/2014 1724   BUN 12 06/04/2017 1205   CREATININE 0.71 06/04/2017 1205   CALCIUM 9.3 06/04/2017 1205   GFRNONAA 105 06/04/2017 1205   GFRAA 121 06/04/2017 1205   Lab Results  Component Value Date   HGBA1C 5.9 (H) 02/06/2018   HGBA1C 5.8 (H) 06/04/2017   Lab Results  Component Value Date   INSULIN 28.5 (H) 06/04/2017   CBC    Component Value Date/Time   WBC 5.4 06/04/2017 1205   WBC 8.9 06/29/2008 0430   RBC 4.49 06/04/2017 1205   RBC 4.28 06/29/2008 0430   HGB 13.2 06/04/2017 1205   HCT 40.6 06/04/2017 1205  PLT 332 06/29/2008 0430   MCV 90 06/04/2017 1205   MCH 29.4 06/04/2017 1205   MCHC 32.5 06/04/2017 1205   MCHC 33.9 06/29/2008 0430   RDW 13.2 06/04/2017 1205   LYMPHSABS 2.6 06/04/2017 1205   MONOABS 0.5 06/29/2008 0430   EOSABS 0.2 06/04/2017 1205   BASOSABS 0.0 06/04/2017 1205   Iron/TIBC/Ferritin/ %Sat No results found for: IRON, TIBC, FERRITIN, IRONPCTSAT Lipid Panel     Component Value Date/Time   CHOL 163 06/04/2017 1205   TRIG 63 06/04/2017 1205   HDL 46 06/04/2017 1205   LDLCALC 104 (H) 06/04/2017 1205   Hepatic Function Panel     Component Value Date/Time   PROT 7.0 06/04/2017 1205   ALBUMIN 4.6 06/04/2017 1205   AST 17 06/04/2017 1205   ALT 14 06/04/2017 1205   ALKPHOS 78 06/04/2017 1205   BILITOT <0.2 06/04/2017 1205      Component Value Date/Time   TSH 1.550 06/04/2017 1205    Results for JILLIENNE, EGNER (MRN [id number]) as of 06/19/2018 17:37  Ref. Range 02/06/2018 11:25  Vitamin D, 25-Hydroxy Latest Ref Range: 30.0 - 100.0 ng/mL 12.8 (L)    I, [place] Crane, am acting as Energy manager for [employer]. Manson Passey, DO  I have reviewed the above documentation for accuracy and completeness, and I agree with the above.  -Corinna Capra, DO

## 2018-06-24 ENCOUNTER — Encounter (INDEPENDENT_AMBULATORY_CARE_PROVIDER_SITE_OTHER): Payer: Self-pay | Admitting: Bariatrics

## 2018-07-03 ENCOUNTER — Ambulatory Visit (INDEPENDENT_AMBULATORY_CARE_PROVIDER_SITE_OTHER): Payer: 59 | Admitting: Bariatrics

## 2018-07-04 MED FILL — metFORMIN HCL 500 MG TABS: 500 | 30 days supply | Qty: 60 | Fill #0

## 2018-07-04 MED FILL — VIT D2 1.25 MG (50,000 UNIT: 1.25 MG | 28 days supply | Qty: 4 | Fill #0

## 2018-07-05 ENCOUNTER — Encounter (INDEPENDENT_AMBULATORY_CARE_PROVIDER_SITE_OTHER): Payer: Self-pay

## 2018-07-10 ENCOUNTER — Encounter: Payer: 59 | Admitting: Internal Medicine

## 2018-07-11 ENCOUNTER — Telehealth: Payer: Self-pay

## 2018-07-11 NOTE — Telephone Encounter (Signed)
Called pt to reschedule missed appt, pt vm full 07/11/2018

## 2018-07-22 ENCOUNTER — Ambulatory Visit (INDEPENDENT_AMBULATORY_CARE_PROVIDER_SITE_OTHER): Payer: 59 | Admitting: Bariatrics

## 2018-07-22 ENCOUNTER — Other Ambulatory Visit: Payer: Self-pay

## 2018-07-22 ENCOUNTER — Encounter (INDEPENDENT_AMBULATORY_CARE_PROVIDER_SITE_OTHER): Payer: Self-pay | Admitting: Bariatrics

## 2018-07-22 DIAGNOSIS — R7303 Prediabetes: Secondary | ICD-10-CM

## 2018-07-22 DIAGNOSIS — Z6841 Body Mass Index (BMI) 40.0 and over, adult: Secondary | ICD-10-CM

## 2018-07-22 DIAGNOSIS — F3289 Other specified depressive episodes: Secondary | ICD-10-CM

## 2018-07-22 DIAGNOSIS — E559 Vitamin D deficiency, unspecified: Secondary | ICD-10-CM | POA: Diagnosis not present

## 2018-07-22 MED ORDER — METFORMIN HCL 500 MG PO TABS: 500.0000 mg | ORAL_TABLET | Freq: Two times a day (BID) | ORAL | 0 refills | Status: DC

## 2018-07-22 MED ORDER — BUPROPION HCL ER (SR) 200 MG PO TB12: 200.0000 mg | ORAL_TABLET | Freq: Every day | ORAL | 0 refills | Status: DC

## 2018-07-22 MED ORDER — VITAMIN D (ERGOCALCIFEROL) 1.25 MG (50000 UNIT) PO CAPS: 50000.0000 [IU] | ORAL_CAPSULE | ORAL | 0 refills | Status: DC

## 2018-07-22 MED FILL — BUPROPION HCL SR 200 MG TAB: 200 | 30 days supply | Qty: 30 | Fill #0

## 2018-07-23 NOTE — Progress Notes (Signed)
Office: [phone]  /  Fax: [phone] TeleHealth Visit:  Jessica Yoder has verbally consented to this TeleHealth visit today. The patient is located at home, the provider is located at the [facility] office. The participants in this visit include the listed provider and patient and any and all parties involved. The visit was conducted today via FaceTime.  HPI:   Chief Complaint: OBESITY Jessica Yoder is here to discuss her progress with her obesity treatment plan. She is on the Category 3 plan and is following her eating plan approximately 50 % of the time. She states she is riding her bike and walking 60 minutes 4 times per week. Jessica Yoder states that she gained weight per her scale, but she does feel like she has gained weight (weight 331 lbs). We were unable to weigh the patient today for this TeleHealth visit. She feels as if she has gained weight since her last visit. She has lost 1 lb since starting treatment with us.  Vitamin D deficiency Jessica Yoder has a diagnosis of vitamin D deficiency. She is currently taking vit D and denies nausea, vomiting or muscle weakness.  Depression with emotional eating behaviors Jessica Yoder is struggling with emotional eating and using food for comfort to the extent that it is negatively impacting her health. She often snacks when she is not hungry. Jessica Yoder sometimes feels she is out of control and then feels guilty that she made poor food choices. She has been working on behavior modification techniques to help reduce her emotional eating and has been somewhat successful. She shows no sign of suicidal or homicidal ideations.  Pre-Diabetes Jessica Yoder has a diagnosis of prediabetes based on her elevated Hgb A1c and was informed this puts her at greater risk of developing diabetes. She is taking metformin currently and continues to work on diet and exercise to decrease risk of diabetes. She denies polyphagia.  ASSESSMENT AND PLAN:  Vitamin D deficiency -  Plan: Vitamin D, Ergocalciferol, (DRISDOL) 1.25 MG (50000 UT) CAPS capsule, VITAMIN D 25 Hydroxy (Vit-D Deficiency, Fractures)  Other depression - Plan: buPROPion (WELLBUTRIN SR) 200 MG 12 hr tablet  Prediabetes - Plan: metFORMIN (GLUCOPHAGE) 500 MG tablet, Hemoglobin A1c, Insulin, random, Lipid Panel With LDL/HDL Ratio, VITAMIN D 25 Hydroxy (Vit-D Deficiency, Fractures)  BMI 50.0-59.9, adult (HCC) - Plan: Comprehensive metabolic panel, Hemoglobin A1c, Insulin, random, Lipid Panel With LDL/HDL Ratio, VITAMIN D 25 Hydroxy (Vit-D Deficiency, Fractures)  PLAN:  Vitamin D Deficiency Jessica Yoder was informed that low vitamin D levels contributes to fatigue and are associated with obesity, breast, and colon cancer. She agrees to continue to take prescription Vit D @50 ,000 IU every week #4 with no refills and will follow up for routine testing of vitamin D, at least 2-3 times per year. She was informed of the risk of over-replacement of vitamin D and agrees to not increase her dose unless she discusses this with us first. Jessica Yoder agrees to follow up as directed.  Depression with Emotional Eating Behaviors We discussed behavior modification techniques today to help Jessica Yoder deal with her emotional eating and depression. She has agreed to continue Wellbutrin SR 200 mg daily #30 with no refills and follow up as directed.  Pre-Diabetes Jessica Yoder will continue to work on weight loss, exercise, and decreasing simple carbohydrates in her diet to help decrease the risk of diabetes. We dicussed metformin including benefits and risks. She was informed that eating too many simple carbohydrates or too many calories at one sitting increases the likelihood of  GI side effects. Jessica Yoder agreed to continue metformin 500 mg two times daily #60 with no refills and follow up with us as directed to monitor her progress.  Obesity Jessica Yoder is currently in the action stage of change. As such, her goal is to continue with weight loss efforts  She has agreed to follow the Category 3 plan Jessica Yoder will continue her exercise regimen for weight loss and overall health benefits. We discussed the following Behavioral Modification Strategies today: planning for success, increase H2O intake, no skipping meals, keeping healthy foods in the home, increasing lean protein intake, decreasing simple carbohydrates, increasing vegetables, decrease eating out and work on meal planning and intentional eating Jessica Yoder will weigh herself at home.  Jessica Yoder has agreed to follow up with our clinic in 3 weeks. She was informed of the importance of frequent follow up visits to maximize her success with intensive lifestyle modifications for her multiple health conditions.  ALLERGIES: Allergies  Allergen Reactions  . Amitriptyline Rash  . Latex Rash  . Saxenda [Liraglutide -Weight Management] Itching    Injection site    MEDICATIONS: Current Outpatient Medications on File Prior to Visit  Medication Sig Dispense Refill  . albuterol (PROVENTIL HFA;VENTOLIN HFA) 108 (90 Base) MCG/ACT inhaler Inhale 2 puffs into the lungs every 6 (six) hours as needed for wheezing or shortness of breath. 1 Inhaler 0  . benzonatate (TESSALON) 100 MG capsule Take 1-2 capsules (100-200 mg total) by mouth 3 (three) times daily. 30 capsule 0  . levonorgestrel (MIRENA) 20 MCG/24HR IUD 1 each by Intrauterine route once.     No current facility-administered medications on file prior to visit.     PAST MEDICAL HISTORY: Past Medical History:  Diagnosis Date  . Allergy   . Elevated hemoglobin A1c   . Migraine   . Vitamin D deficiency     PAST SURGICAL HISTORY: Past Surgical History:  Procedure Laterality Date  . EYE SURGERY  2011   Lasik  . LAPAROSCOPIC GASTRIC BANDING  06/28/2008    SOCIAL HISTORY: Social History   Tobacco Use  . Smoking status: Never Smoker  . Smokeless tobacco: Never Used  Substance Use Topics  . Alcohol use: Yes    Comment: socially  . Drug use:  No    FAMILY HISTORY: Family History  Problem Relation Age of Onset  . Other Father        ETOH Abuse  . Thyroid disease Mother   . Obesity Mother   . Cancer Other   . Hyperlipidemia Other   . Stroke Other   . Diabetes Other   . Obesity Other   . Sleep apnea Other     ROS: Review of Systems  Constitutional: Negative for weight loss.  Gastrointestinal: Negative for nausea and vomiting.  Musculoskeletal:       Negative for muscle weakness  Endo/Heme/Allergies:       Negative for polyphagia  Psychiatric/Behavioral: Positive for depression. Negative for suicidal ideas.    PHYSICAL EXAM: Pt in no acute distress  RECENT LABS AND TESTS: BMET    Component Value Date/Time   NA 142 06/04/2017 1205   K 4.5 06/04/2017 1205   CL 105 06/04/2017 1205   CO2 25 06/04/2017 1205   GLUCOSE 81 06/04/2017 1205   GLUCOSE 105 (H) 12/12/2014 1724   BUN 12 06/04/2017 1205   CREATININE 0.71 06/04/2017 1205   CALCIUM 9.3 06/04/2017 1205   GFRNONAA 105 06/04/2017 1205   GFRAA 121 06/04/2017 1205   Lab Results  Component Value Date   HGBA1C 5.9 (H) 02/06/2018   HGBA1C 5.8 (H) 06/04/2017   Lab Results  Component Value Date   INSULIN 28.5 (H) 06/04/2017   CBC    Component Value Date/Time   WBC 5.4 06/04/2017 1205   WBC 8.9 06/29/2008 0430   RBC 4.49 06/04/2017 1205   RBC 4.28 06/29/2008 0430   HGB 13.2 06/04/2017 1205   HCT 40.6 06/04/2017 1205   PLT 332 06/29/2008 0430   MCV 90 06/04/2017 1205   MCH 29.4 06/04/2017 1205   MCHC 32.5 06/04/2017 1205   MCHC 33.9 06/29/2008 0430   RDW 13.2 06/04/2017 1205   LYMPHSABS 2.6 06/04/2017 1205   MONOABS 0.5 06/29/2008 0430   EOSABS 0.2 06/04/2017 1205   BASOSABS 0.0 06/04/2017 1205   Iron/TIBC/Ferritin/ %Sat No results found for: IRON, TIBC, FERRITIN, IRONPCTSAT Lipid Panel     Component Value Date/Time   CHOL 163 06/04/2017 1205   TRIG 63 06/04/2017 1205   HDL 46 06/04/2017 1205   LDLCALC 104 (H) 06/04/2017 1205    Hepatic Function Panel     Component Value Date/Time   PROT 7.0 06/04/2017 1205   ALBUMIN 4.6 06/04/2017 1205   AST 17 06/04/2017 1205   ALT 14 06/04/2017 1205   ALKPHOS 78 06/04/2017 1205   BILITOT <0.2 06/04/2017 1205      Component Value Date/Time   TSH 1.550 06/04/2017 1205     Ref. Range 02/06/2018 11:25  Vitamin D, 25-Hydroxy Latest Ref Range: 30.0 - 100.0 ng/mL 12.8 (L)    I, Doreene Nest, am acting as Location manager for [employer]. Owens Shark, DO  I have reviewed the above documentation for accuracy and completeness, and I agree with the above. -Jearld Lesch, DO

## 2018-08-11 DIAGNOSIS — R7303 Prediabetes: Secondary | ICD-10-CM | POA: Diagnosis not present

## 2018-08-11 DIAGNOSIS — E559 Vitamin D deficiency, unspecified: Secondary | ICD-10-CM | POA: Diagnosis not present

## 2018-08-12 ENCOUNTER — Other Ambulatory Visit: Payer: Self-pay

## 2018-08-12 ENCOUNTER — Telehealth (INDEPENDENT_AMBULATORY_CARE_PROVIDER_SITE_OTHER): Payer: 59 | Admitting: Bariatrics

## 2018-08-12 ENCOUNTER — Encounter (INDEPENDENT_AMBULATORY_CARE_PROVIDER_SITE_OTHER): Payer: Self-pay | Admitting: Bariatrics

## 2018-08-12 DIAGNOSIS — F3289 Other specified depressive episodes: Secondary | ICD-10-CM | POA: Diagnosis not present

## 2018-08-12 DIAGNOSIS — Z6841 Body Mass Index (BMI) 40.0 and over, adult: Secondary | ICD-10-CM

## 2018-08-12 DIAGNOSIS — R7303 Prediabetes: Secondary | ICD-10-CM

## 2018-08-12 DIAGNOSIS — E559 Vitamin D deficiency, unspecified: Secondary | ICD-10-CM | POA: Diagnosis not present

## 2018-08-12 LAB — COMPREHENSIVE METABOLIC PANEL
ALT: 10 IU/L (ref 0–32)
AST: 18 IU/L (ref 0–40)
Albumin/Globulin Ratio: 2.1 (ref 1.2–2.2)
Albumin: 4.5 g/dL (ref 3.8–4.8)
Alkaline Phosphatase: 83 IU/L (ref 39–117)
BUN/Creatinine Ratio: 14 (ref 9–23)
BUN: 10 mg/dL (ref 6–24)
Bilirubin Total: 0.3 mg/dL (ref 0.0–1.2)
CO2: 20 mmol/L (ref 20–29)
Calcium: 8.9 mg/dL (ref 8.7–10.2)
Chloride: 103 mmol/L (ref 96–106)
Creatinine, Ser: 0.73 mg/dL (ref 0.57–1.00)
GFR calc Af Amer: 117 mL/min/{1.73_m2} (ref >59)
GFR calc non Af Amer: 101 mL/min/{1.73_m2} (ref >59)
Globulin, Total: 2.1 g/dL (ref 1.5–4.5)
Glucose: 83 mg/dL (ref 65–99)
Potassium: 4.5 mmol/L (ref 3.5–5.2)
Sodium: 141 mmol/L (ref 134–144)
Total Protein: 6.6 g/dL (ref 6.0–8.5)

## 2018-08-12 LAB — HEMOGLOBIN A1C
Est. average glucose Bld gHb Est-mCnc: 114 mg/dL
Hgb A1c MFr Bld: 5.6 % (ref 4.8–5.6)

## 2018-08-12 LAB — LIPID PANEL WITH LDL/HDL RATIO
Cholesterol, Total: 191 mg/dL (ref 100–199)
HDL: 45 mg/dL (ref >39)
LDL Calculated: 120 mg/dL — ABNORMAL HIGH (ref 0–99)
LDl/HDL Ratio: 2.7 ratio (ref 0.0–3.2)
Triglycerides: 128 mg/dL (ref 0–149)
VLDL Cholesterol Cal: 26 mg/dL (ref 5–40)

## 2018-08-12 LAB — INSULIN, RANDOM: INSULIN: 29.2 u[IU]/mL — ABNORMAL HIGH (ref 2.6–24.9)

## 2018-08-12 LAB — VITAMIN D 25 HYDROXY (VIT D DEFICIENCY, FRACTURES): Vit D, 25-Hydroxy: 23 ng/mL — ABNORMAL LOW (ref 30.0–100.0)

## 2018-08-12 MED ORDER — VITAMIN D3 1.25 MG (50000 UT) PO CAPS: 1.0000 | ORAL_CAPSULE | ORAL | 0 refills | Status: DC

## 2018-08-12 MED ORDER — BUPROPION HCL ER (SR) 200 MG PO TB12: 200.0000 mg | ORAL_TABLET | Freq: Every day | ORAL | 0 refills | Status: DC

## 2018-08-12 MED ORDER — METFORMIN HCL 500 MG PO TABS: 500.0000 mg | ORAL_TABLET | Freq: Two times a day (BID) | ORAL | 0 refills | Status: DC

## 2018-08-12 MED FILL — VIT D3-50 50,000 UNITS CAPS: 1.25 MG | 28 days supply | Qty: 4 | Fill #0

## 2018-08-12 MED FILL — metFORMIN HCL 500 MG TABS: 500 | 30 days supply | Qty: 60 | Fill #0

## 2018-08-13 NOTE — Progress Notes (Signed)
Office: [phone]  /  Fax: [phone] TeleHealth Visit:  Jessica Yoder has verbally consented to this TeleHealth visit today. The patient is located in the car, the provider is located at the [facility] office. The participants in this visit include the listed provider and patient. The visit was conducted today via FaceTime.  HPI:   Chief Complaint: OBESITY Jessica Yoder is here to discuss her progress with her obesity treatment plan. She is on the Category 3 plan and is following her eating plan approximately 70% of the time. She states she is walking 45 minutes 2 times per week and biking 30-45 minutes 4 times per week. Jessica Yoder states that her weight remains the same (weight 330). She is not struggling with any issues and reports she is drinking about 80 ounces of water. We were unable to weigh the patient today for this TeleHealth visit. She feels as if she has maintained her weight since her last visit. She has lost 0 lbs since starting treatment with us.  Insulin Resistance Jessica Yoder has a diagnosis of insulin resistance based on her elevated fasting insulin level >5. Her last A1c was 5.6 on 08/11/2018. Although Jessica Yoder's blood glucose readings are still under good control, insulin resistance puts her at greater risk of metabolic syndrome and diabetes. She is taking metformin currently and continues to work on diet and exercise to decrease risk of diabetes. No polyphagia.  Depression with emotional eating behaviors Jessica Yoder is struggling with emotional eating and using food for comfort to the extent that it is negatively impacting her health. She often snacks when she is not hungry. Jessica Yoder sometimes feels she is out of control and then feels guilty that she made poor food choices. She has been working on behavior modification techniques to help reduce her emotional eating and has been somewhat successful. Jessica Yoder is taking Wellbutrin and shows no sign of suicidal or homicidal  ideations.  [facility] 02/06/2018 06/04/2017 03/23/2015 09/14/2014  Decreased Interest 0 1 0 0  Down, Depressed, Hopeless 0 1 0 0  PHQ - 2 Score 0 2 0 0  Altered sleeping - 0 - -  Tired, decreased energy - 2 - -  Change in appetite - 3 - -  Feeling bad or failure about yourself  - 2 - -  Trouble concentrating - 0 - -  Moving slowly or fidgety/restless - 0 - -  Suicidal thoughts - 0 - -  PHQ-9 Score - 9 - -  Difficult doing work/chores - Somewhat difficult - -   Vitamin D deficiency Jessica Yoder has a diagnosis of Vitamin D deficiency. She is currently taking prescription Vit D and denies nausea, vomiting or muscle weakness.  ASSESSMENT AND PLAN:  Prediabetes - Plan: metFORMIN (GLUCOPHAGE) 500 MG tablet  Other depression - Plan: buPROPion (WELLBUTRIN SR) 200 MG 12 hr tablet  Vitamin D deficiency - Plan: Cholecalciferol (VITAMIN D3) 1.25 MG (50000 UT) CAPS  BMI 50.0-59.9, adult (HCC)  PLAN:  Insulin Resistance Jessica Yoder will continue to work on weight loss, exercise, and decreasing simple carbohydrates in her diet to help decrease the risk of diabetes. We dicussed metformin including benefits and risks. She was informed that eating too many simple carbohydrates or too many calories at one sitting increases the likelihood of GI side effects. Jessica Yoder was given a refill on her metformin 500 mg 1 PO BID with meals #60 with 0 refills and agrees to follow-up with our clinic in 2 weeks.  Depression with Emotional Eating  Behaviors We discussed behavior modification techniques today to help Jessica Yoder deal with her emotional eating and depression. Jessica Yoder was given a refill on her Wellbutrin 200 mg 1 PO QD #30 with 0 refills and agrees to follow-up with our clinic in 2 weeks.  Vitamin D Deficiency Jessica Yoder was informed that low Vitamin D levels contributes to fatigue and are associated with obesity, breast, and colon cancer. She agrees to continue to take prescription Vit D @ 50,000 IU every week  #4 with 0 refills and will follow-up for routine testing of Vitamin D, at least 2-3 times per year. She was informed of the risk of over-replacement of Vitamin D and agrees to not increase her dose unless she discusses this with us first. Jessica Yoder agrees to follow-up with our clinic in 2 weeks.  Obesity Jessica Yoder is currently in the action stage of change. As such, her goal is to continue with weight loss efforts. She has agreed to follow the Category 3 plan. Jessica Yoder will work on meal planning and intentional eating. She will switch dinner for lunch and will "full on" journal. Jessica Yoder has been instructed to continue her current exercise regimen for weight loss and overall health benefits. We discussed the following Behavioral Modification Strategies today: increasing lean protein intake, decreasing simple carbohydrates, increasing vegetables, increase H20 intake, decrease eating out, no skipping meals, work on meal planning and easy cooking plans, and keeping healthy foods in the home.   Jessica Yoder has agreed to follow-up with our clinic in 2 weeks. She was informed of the importance of frequent follow-up visits to maximize her success with intensive lifestyle modifications for her multiple health conditions.  ALLERGIES: Allergies  Allergen Reactions  . Amitriptyline Rash  . Latex Rash  . Saxenda [Liraglutide -Weight Management] Itching    Injection site    MEDICATIONS: Current Outpatient Medications on File Prior to Visit  Medication Sig Dispense Refill  . albuterol (PROVENTIL HFA;VENTOLIN HFA) 108 (90 Base) MCG/ACT inhaler Inhale 2 puffs into the lungs every 6 (six) hours as needed for wheezing or shortness of breath. 1 Inhaler 0  . benzonatate (TESSALON) 100 MG capsule Take 1-2 capsules (100-200 mg total) by mouth 3 (three) times daily. 30 capsule 0  . levonorgestrel (MIRENA) 20 MCG/24HR IUD 1 each by Intrauterine route once.     No current facility-administered medications on file prior to  visit.     PAST MEDICAL HISTORY: Past Medical History:  Diagnosis Date  . Allergy   . Elevated hemoglobin A1c   . Migraine   . Vitamin D deficiency     PAST SURGICAL HISTORY: Past Surgical History:  Procedure Laterality Date  . EYE SURGERY  2011   Lasik  . LAPAROSCOPIC GASTRIC BANDING  06/28/2008    SOCIAL HISTORY: Social History   Tobacco Use  . Smoking status: Never Smoker  . Smokeless tobacco: Never Used  Substance Use Topics  . Alcohol use: Yes    Comment: socially  . Drug use: No    FAMILY HISTORY: Family History  Problem Relation Age of Onset  . Other Father        ETOH Abuse  . Thyroid disease Mother   . Obesity Mother   . Cancer Other   . Hyperlipidemia Other   . Stroke Other   . Diabetes Other   . Obesity Other   . Sleep apnea Other    ROS: Review of Systems  Endo/Heme/Allergies:       Negative for polyphagia.  Psychiatric/Behavioral: Positive for depression (  emotional eating). Negative for suicidal ideas.       Negative for homicidal ideas.   PHYSICAL EXAM: Pt in no acute distress  RECENT LABS AND TESTS: BMET    Component Value Date/Time   NA 141 08/11/2018 0849   K 4.5 08/11/2018 0849   CL 103 08/11/2018 0849   CO2 20 08/11/2018 0849   GLUCOSE 83 08/11/2018 0849   GLUCOSE 105 (H) 12/12/2014 1724   BUN 10 08/11/2018 0849   CREATININE 0.73 08/11/2018 0849   CALCIUM 8.9 08/11/2018 0849   GFRNONAA 101 08/11/2018 0849   GFRAA 117 08/11/2018 0849   Lab Results  Component Value Date   HGBA1C 5.6 08/11/2018   HGBA1C 5.9 (H) 02/06/2018   HGBA1C 5.8 (H) 06/04/2017   Lab Results  Component Value Date   INSULIN 29.2 (H) 08/11/2018   INSULIN 28.5 (H) 06/04/2017   CBC    Component Value Date/Time   WBC 5.4 06/04/2017 1205   WBC 8.9 06/29/2008 0430   RBC 4.49 06/04/2017 1205   RBC 4.28 06/29/2008 0430   HGB 13.2 06/04/2017 1205   HCT 40.6 06/04/2017 1205   PLT 332 06/29/2008 0430   MCV 90 06/04/2017 1205   MCH 29.4 06/04/2017  1205   MCHC 32.5 06/04/2017 1205   MCHC 33.9 06/29/2008 0430   RDW 13.2 06/04/2017 1205   LYMPHSABS 2.6 06/04/2017 1205   MONOABS 0.5 06/29/2008 0430   EOSABS 0.2 06/04/2017 1205   BASOSABS 0.0 06/04/2017 1205   Iron/TIBC/Ferritin/ %Sat No results found for: IRON, TIBC, FERRITIN, IRONPCTSAT Lipid Panel     Component Value Date/Time   CHOL 191 08/11/2018 0849   TRIG 128 08/11/2018 0849   HDL 45 08/11/2018 0849   LDLCALC 120 (H) 08/11/2018 0849   Hepatic Function Panel     Component Value Date/Time   PROT 6.6 08/11/2018 0849   ALBUMIN 4.5 08/11/2018 0849   AST 18 08/11/2018 0849   ALT 10 08/11/2018 0849   ALKPHOS 83 08/11/2018 0849   BILITOT 0.3 08/11/2018 0849      Component Value Date/Time   TSH 1.550 06/04/2017 1205   Results for LAMOINE, FREDRICKSEN (MRN [id number]) as of 08/13/2018 12:09  Ref. Range 08/11/2018 08:49  Vitamin D, 25-Hydroxy Latest Ref Range: 30.0 - 100.0 ng/mL 23.0 (L)   I, Michaelene Song, am acting as Location manager for [employer], DO  I have reviewed the above documentation for accuracy and completeness, and I agree with the above. -Jearld Lesch, DO

## 2018-08-15 MED FILL — BUPROPION HCL SR 200 MG TAB: 200 | 30 days supply | Qty: 30 | Fill #0

## 2018-08-26 ENCOUNTER — Other Ambulatory Visit: Payer: Self-pay

## 2018-08-26 ENCOUNTER — Encounter (INDEPENDENT_AMBULATORY_CARE_PROVIDER_SITE_OTHER): Payer: Self-pay | Admitting: Bariatrics

## 2018-08-26 ENCOUNTER — Telehealth (INDEPENDENT_AMBULATORY_CARE_PROVIDER_SITE_OTHER): Payer: 59 | Admitting: Bariatrics

## 2018-08-26 DIAGNOSIS — Z6841 Body Mass Index (BMI) 40.0 and over, adult: Secondary | ICD-10-CM

## 2018-08-26 DIAGNOSIS — E559 Vitamin D deficiency, unspecified: Secondary | ICD-10-CM

## 2018-08-26 DIAGNOSIS — R7303 Prediabetes: Secondary | ICD-10-CM

## 2018-08-26 NOTE — Progress Notes (Signed)
Office: [phone]  /  Fax: [phone] TeleHealth Visit:  Jessica Yoder has verbally consented to this TeleHealth visit today. The patient is walking, the provider is located at the [facility] office. The participants in this visit include the listed provider and patient. The visit was conducted today via FaceTime.  HPI:   Chief Complaint: OBESITY [name] is here to discuss her progress with her obesity treatment plan. She is on the Category 3 plan and is following her eating plan approximately 65% of the time. She states she is walking 3 to 3.5 miles 4 times per week. [name] thinks that she may have lost weight; she did not weigh herself. She states that she is doing well overall.  We were unable to weigh the patient today for this TeleHealth visit. She feels as if she has lost weight since her last visit. She has lost 0 lbs since starting treatment with [name].  Vitamin D deficiency [name] has a diagnosis of Vitamin D deficiency. Her last Vitamin D level was 23.0 on 08/11/2018. She is currently taking Vit D and denies nausea, vomiting or muscle weakness.  Pre-Diabetes [name] has a diagnosis of prediabetes based on her elevated Hgb A1c and was informed this puts her at greater risk of developing diabetes. She is taking metformin currently and continues to work on diet and exercise to decrease risk of diabetes. She denies polyphagia.  ASSESSMENT AND PLAN:  Vitamin D deficiency  Prediabetes  BMI 50.0-59.9, adult ([place])  PLAN:  Vitamin D Deficiency [name] was informed that low Vitamin D levels contributes to fatigue and are associated with obesity, breast, and colon cancer. She agrees to continue taking Vit D and will follow-up for routine testing of Vitamin D, at least 2-3 times per year. She was informed of the risk of over-replacement of Vitamin D and agrees to not increase her dose unless she discusses this with [name] first. Jury agrees to follow-up with our clinic in  2 weeks.  Pre-Diabetes [name] will continue to work on weight loss, exercise, and decreasing simple carbohydrates in her diet to help decrease the risk of diabetes. We dicussed metformin including benefits and risks. She was informed that eating too many simple carbohydrates or too many calories at one sitting increases the likelihood of GI side effects. [name] will continue metformin and follow-up with [name] as directed to monitor her progress.  Obesity [name] is currently in the action stage of change. As such, her goal is to continue with weight loss efforts. She has agreed to follow the Category 3 plan. [name] will work on meal planning, intentional eating, uses "My Fitness Pal", increase protein, and weigh meat. [name] has been instructed to continue her current exercise regimen and increase exerciser for weight loss and overall health benefits. We discussed the following Behavioral Modification Strategies today: increasing lean protein intake, decreasing simple carbohydrates, increasing vegetables, increase H20 intake, decrease eating out, no skipping meals, work on meal planning and easy cooking plans, keeping healthy foods in the home, and planning for success.  [name] has agreed to follow-up with our clinic in 2 weeks. She was informed of the importance of frequent follow-up visits to maximize her success with intensive lifestyle modifications for her multiple health conditions.  ALLERGIES: Allergies  Allergen Reactions   Amitriptyline Rash   Latex Rash   Saxenda [Liraglutide -Weight Management] Itching    Injection site    MEDICATIONS: Current Outpatient Medications on File Prior to Visit  Medication Sig Dispense Refill  albuterol (PROVENTIL HFA;VENTOLIN HFA) 108 (90 Base) MCG/ACT inhaler Inhale 2 puffs into the lungs every 6 (six) hours as needed for wheezing or shortness of breath. 1 Inhaler 0   benzonatate (TESSALON) 100 MG capsule Take 1-2 capsules (100-200 mg total) by  mouth 3 (three) times daily. 30 capsule 0   buPROPion (WELLBUTRIN SR) 200 MG 12 hr tablet Take 1 tablet (200 mg total) by mouth daily. 30 tablet 0   Cholecalciferol (VITAMIN D3) 1.25 MG (50000 UT) CAPS Take 1 Dose by mouth once a week. 4 capsule 0   levonorgestrel (MIRENA) 20 MCG/24HR IUD 1 each by Intrauterine route once.     metFORMIN (GLUCOPHAGE) 500 MG tablet Take 1 tablet (500 mg total) by mouth 2 (two) times daily with a meal. 60 tablet 0   No current facility-administered medications on file prior to visit.     PAST MEDICAL HISTORY: Past Medical History:  Diagnosis Date   Allergy    Elevated hemoglobin A1c    Migraine    Vitamin D deficiency     PAST SURGICAL HISTORY: Past Surgical History:  Procedure Laterality Date   EYE SURGERY  2011   Lasik   LAPAROSCOPIC GASTRIC BANDING  06/28/2008    SOCIAL HISTORY: Social History   Tobacco Use   Smoking status: Never Smoker   Smokeless tobacco: Never Used  Substance Use Topics   Alcohol use: Yes    Comment: socially   Drug use: No    FAMILY HISTORY: Family History  Problem Relation Age of Onset   Other Father        ETOH Abuse   Thyroid disease Mother    Obesity Mother    Cancer Other    Hyperlipidemia Other    Stroke Other    Diabetes Other    Obesity Other    Sleep apnea Other    ROS: Review of Systems  Gastrointestinal: Negative for nausea and vomiting.  Musculoskeletal:       Negative for muscle weakness.  Endo/Heme/Allergies:       Negative for polyphagia.   PHYSICAL EXAM: Pt in no acute distress  RECENT LABS AND TESTS: BMET    Component Value Date/Time   NA 141 08/11/2018 0849   K 4.5 08/11/2018 0849   CL 103 08/11/2018 0849   CO2 20 08/11/2018 0849   GLUCOSE 83 08/11/2018 0849   GLUCOSE 105 (H) 12/12/2014 1724   BUN 10 08/11/2018 0849   CREATININE 0.73 08/11/2018 0849   CALCIUM 8.9 08/11/2018 0849   GFRNONAA 101 08/11/2018 0849   GFRAA 117 08/11/2018 0849   Lab  Results  Component Value Date   HGBA1C 5.6 08/11/2018   HGBA1C 5.9 (H) 02/06/2018   HGBA1C 5.8 (H) 06/04/2017   Lab Results  Component Value Date   INSULIN 29.2 (H) 08/11/2018   INSULIN 28.5 (H) 06/04/2017   CBC    Component Value Date/Time   WBC 5.4 06/04/2017 1205   WBC 8.9 06/29/2008 0430   RBC 4.49 06/04/2017 1205   RBC 4.28 06/29/2008 0430   HGB 13.2 06/04/2017 1205   HCT 40.6 06/04/2017 1205   PLT 332 06/29/2008 0430   MCV 90 06/04/2017 1205   MCH 29.4 06/04/2017 1205   MCHC 32.5 06/04/2017 1205   MCHC 33.9 06/29/2008 0430   RDW 13.2 06/04/2017 1205   LYMPHSABS 2.6 06/04/2017 1205   MONOABS 0.5 06/29/2008 0430   EOSABS 0.2 06/04/2017 1205   BASOSABS 0.0 06/04/2017 1205   Iron/TIBC/Ferritin/ %Sat No results  found for: IRON, TIBC, FERRITIN, IRONPCTSAT Lipid Panel     Component Value Date/Time   CHOL 191 08/11/2018 0849   TRIG 128 08/11/2018 0849   HDL 45 08/11/2018 0849   LDLCALC 120 (H) 08/11/2018 0849   Hepatic Function Panel     Component Value Date/Time   PROT 6.6 08/11/2018 0849   ALBUMIN 4.5 08/11/2018 0849   AST 18 08/11/2018 0849   ALT 10 08/11/2018 0849   ALKPHOS 83 08/11/2018 0849   BILITOT 0.3 08/11/2018 0849      Component Value Date/Time   TSH 1.550 06/04/2017 1205   Results for Gildardo PoundsCAPLE, Kennisha J (MRN [id number]) as of 08/26/2018 11:10  Ref. Range 08/11/2018 08:49  Vitamin D, 25-Hydroxy Latest Ref Range: 30.0 - 100.0 ng/mL 23.0 (L)    I, Marianna Paymentenise Haag, am acting as Energy managertranscriptionist for [employer] Maeleigh Buschman, DO  I have reviewed the above documentation for accuracy and completeness, and I agree with the above. -Corinna CapraAngel Gemini Beaumier, DO

## 2018-09-10 ENCOUNTER — Ambulatory Visit (INDEPENDENT_AMBULATORY_CARE_PROVIDER_SITE_OTHER): Payer: 59 | Admitting: Bariatrics

## 2018-09-11 ENCOUNTER — Encounter (INDEPENDENT_AMBULATORY_CARE_PROVIDER_SITE_OTHER): Payer: Self-pay

## 2018-10-16 DIAGNOSIS — H5213 Myopia, bilateral: Secondary | ICD-10-CM | POA: Diagnosis not present

## 2018-10-16 DIAGNOSIS — H524 Presbyopia: Secondary | ICD-10-CM | POA: Diagnosis not present

## 2018-12-01 MED FILL — IBUPROFEN 800 MG TABS: 800 | 30 days supply | Qty: 90 | Fill #0

## 2019-05-04 ENCOUNTER — Other Ambulatory Visit: Payer: Self-pay

## 2019-05-04 ENCOUNTER — Ambulatory Visit (INDEPENDENT_AMBULATORY_CARE_PROVIDER_SITE_OTHER): Payer: 59 | Admitting: Nurse Practitioner

## 2019-05-04 ENCOUNTER — Encounter: Payer: Self-pay | Admitting: Nurse Practitioner

## 2019-05-04 VITALS — BP 122/82 | HR 71 | Temp 98.2°F | Wt 331.0 lb

## 2019-05-04 DIAGNOSIS — R7303 Prediabetes: Secondary | ICD-10-CM

## 2019-05-04 DIAGNOSIS — E559 Vitamin D deficiency, unspecified: Secondary | ICD-10-CM | POA: Diagnosis not present

## 2019-05-04 DIAGNOSIS — F3289 Other specified depressive episodes: Secondary | ICD-10-CM | POA: Diagnosis not present

## 2019-05-04 DIAGNOSIS — Z0000 Encounter for general adult medical examination without abnormal findings: Secondary | ICD-10-CM

## 2019-05-04 DIAGNOSIS — Z6841 Body Mass Index (BMI) 40.0 and over, adult: Secondary | ICD-10-CM

## 2019-05-04 LAB — POCT UA - MICROALBUMIN

## 2019-05-04 LAB — POCT URINALYSIS DIPSTICK
Bilirubin, UA: NEGATIVE
Glucose, UA: NEGATIVE
Ketones, UA: NEGATIVE
Leukocytes, UA: NEGATIVE
Nitrite, UA: NEGATIVE
Protein, UA: NEGATIVE
Spec Grav, UA: >=1.03 — AB (ref 1.010–1.025)
Urobilinogen, UA: 1 E.U./dL
pH, UA: 5.5 (ref 5.0–8.0)

## 2019-05-04 MED ORDER — BUPROPION HCL ER (SR) 200 MG PO TB12: 200.0000 mg | ORAL_TABLET | Freq: Every day | ORAL | 1 refills | Status: DC

## 2019-05-04 MED ORDER — METFORMIN HCL 500 MG PO TABS: 500.0000 mg | ORAL_TABLET | Freq: Two times a day (BID) | ORAL | 1 refills | Status: DC

## 2019-05-04 MED FILL — metFORMIN HCL 500 MG TABS: 500 | 90 days supply | Qty: 180 | Fill #0

## 2019-05-04 MED FILL — BUPROPION HCL SR 200 MG TAB: 200 | 90 days supply | Qty: 90 | Fill #0

## 2019-05-04 NOTE — Patient Instructions (Signed)
Health Maintenance, Female Adopting a healthy lifestyle and getting preventive care are important in promoting health and wellness. Ask your health care provider about:  The right schedule for you to have regular tests and exams.  Things you can do on your own to prevent diseases and keep yourself healthy. What should I know about diet, weight, and exercise? Eat a healthy diet   Eat a diet that includes plenty of vegetables, fruits, low-fat dairy products, and lean protein.  Do not eat a lot of foods that are high in solid fats, added sugars, or sodium. Maintain a healthy weight Body mass index (BMI) is used to identify weight problems. It estimates body fat based on height and weight. Your health care provider can help determine your BMI and help you achieve or maintain a healthy weight. Get regular exercise Get regular exercise. This is one of the most important things you can do for your health. Most adults should:  Exercise for at least 150 minutes each week. The exercise should increase your heart rate and make you sweat (moderate-intensity exercise).  Do strengthening exercises at least twice a week. This is in addition to the moderate-intensity exercise.  Spend less time sitting. Even light physical activity can be beneficial. Watch cholesterol and blood lipids Have your blood tested for lipids and cholesterol at 44 years of age, then have this test every 5 years. Have your cholesterol levels checked more often if:  Your lipid or cholesterol levels are high.  You are older than 44 years of age.  You are at high risk for heart disease. What should I know about cancer screening? Depending on your health history and family history, you may need to have cancer screening at various ages. This may include screening for:  Breast cancer.  Cervical cancer.  Colorectal cancer.  Skin cancer.  Lung cancer. What should I know about heart disease, diabetes, and high blood  pressure? Blood pressure and heart disease  High blood pressure causes heart disease and increases the risk of stroke. This is more likely to develop in people who have high blood pressure readings, are of African descent, or are overweight.  Have your blood pressure checked: ? Every 3-5 years if you are 18-39 years of age. ? Every year if you are 40 years old or older. Diabetes Have regular diabetes screenings. This checks your fasting blood sugar level. Have the screening done:  Once every three years after age 40 if you are at a normal weight and have a low risk for diabetes.  More often and at a younger age if you are overweight or have a high risk for diabetes. What should I know about preventing infection? Hepatitis B If you have a higher risk for hepatitis B, you should be screened for this virus. Talk with your health care provider to find out if you are at risk for hepatitis B infection. Hepatitis C Testing is recommended for:  Everyone born from 1945 through 1965.  Anyone with known risk factors for hepatitis C. Sexually transmitted infections (STIs)  Get screened for STIs, including gonorrhea and chlamydia, if: ? You are sexually active and are younger than 44 years of age. ? You are older than 44 years of age and your health care provider tells you that you are at risk for this type of infection. ? Your sexual activity has changed since you were last screened, and you are at increased risk for chlamydia or gonorrhea. Ask your health care provider if   you are at risk.  Ask your health care provider about whether you are at high risk for HIV. Your health care provider may recommend a prescription medicine to help prevent HIV infection. If you choose to take medicine to prevent HIV, you should first get tested for HIV. You should then be tested every 3 months for as long as you are taking the medicine. Pregnancy  If you are about to stop having your period (premenopausal) and  you may become pregnant, seek counseling before you get pregnant.  Take 400 to 800 micrograms (mcg) of folic acid every day if you become pregnant.  Ask for birth control (contraception) if you want to prevent pregnancy. Osteoporosis and menopause Osteoporosis is a disease in which the bones lose minerals and strength with aging. This can result in bone fractures. If you are 65 years old or older, or if you are at risk for osteoporosis and fractures, ask your health care provider if you should:  Be screened for bone loss.  Take a calcium or vitamin D supplement to lower your risk of fractures.  Be given hormone replacement therapy (HRT) to treat symptoms of menopause. Follow these instructions at home: Lifestyle  Do not use any products that contain nicotine or tobacco, such as cigarettes, e-cigarettes, and chewing tobacco. If you need help quitting, ask your health care provider.  Do not use street drugs.  Do not share needles.  Ask your health care provider for help if you need support or information about quitting drugs. Alcohol use  Do not drink alcohol if: ? Your health care provider tells you not to drink. ? You are pregnant, may be pregnant, or are planning to become pregnant.  If you drink alcohol: ? Limit how much you use to 0-1 drink a day. ? Limit intake if you are breastfeeding.  Be aware of how much alcohol is in your drink. In the [place] one drink equals one 12 oz bottle of beer (355 mL), one 5 oz glass of wine (148 mL), or one 1 oz glass of hard liquor (44 mL). General instructions  Schedule regular health, dental, and eye exams.  Stay current with your vaccines.  Tell your health care provider if: ? You often feel depressed. ? You have ever been abused or do not feel safe at home. Summary  Adopting a healthy lifestyle and getting preventive care are important in promoting health and wellness.  Follow your health care provider's instructions about healthy  diet, exercising, and getting tested or screened for diseases.  Follow your health care provider's instructions on monitoring your cholesterol and blood pressure. This information is not intended to replace advice given to you by your health care provider. Make sure you discuss any questions you have with your health care provider. Document Revised: 01/01/2018 Document Reviewed: 01/01/2018 Elsevier Patient Education  2020 [employer]  

## 2019-05-04 NOTE — Progress Notes (Signed)
This visit occurred during the SARS-CoV-2 public health emergency.  Safety protocols were in place, including screening questions prior to the visit, additional usage of staff PPE, and extensive cleaning of exam room while observing appropriate contact time as indicated for disinfecting solutions.  Subjective:     Patient ID: Jessica Yoder , female    DOB: 02-Feb-1975 , 44 y.o.   MRN: [id number]   Chief Complaint  Patient presents with  . Annual Exam    HPI  Here for HM  Wt Readings from Last 3 Encounters: 05/04/19 : (!) 331 lb (150.1 kg) 03/25/18 : (!) 337 lb (152.9 kg) 03/10/18 : (!) 336 lb (152.4 kg)  She has had her covid vaccine - fatigue for 1 day.  She has not had metformin or wellbutrin.  She had decreased her activity over the last few months.   She was [facility] to [facility] - she was getting her medications through them   The patient states she uses IUD for birth control. Negative for Dysmenorrhea and Negative for Menorrhagia Mammogram last done - [facility] - scheduled May 28, 2019 and her PAP due on same day.  Negative for: breast discharge, breast lump(s), breast pain and breast self exam.  Pertinent negatives include abnormal bleeding (hematology), anxiety, decreased libido, depression, difficulty falling sleep, dyspareunia, history of infertility, nocturia, sexual dysfunction, sleep disturbances, urinary incontinence, urinary urgency, vaginal discharge and vaginal itching. Diet regular- was doing optivia - had lost 18 lbs. The patient states her exercise level is      The patient's tobacco use is:  Social History   Tobacco Use  Smoking Status Never Smoker  Smokeless Tobacco Never Used  . She has been exposed to passive smoke. The patient's alcohol use is:  Social History   Substance and Sexual Activity  Alcohol Use Yes   Comment: socially    Past Medical History:  Diagnosis Date  . Allergy   . Elevated hemoglobin A1c   . Migraine   .  Vitamin D deficiency      Family History  Problem Relation Age of Onset  . Other Father        ETOH Abuse  . Thyroid disease Mother   . Obesity Mother   . Cancer Other   . Hyperlipidemia Other   . Stroke Other   . Diabetes Other   . Obesity Other   . Sleep apnea Other      Current Outpatient Medications:  .  albuterol (PROVENTIL HFA;VENTOLIN HFA) 108 (90 Base) MCG/ACT inhaler, Inhale 2 puffs into the lungs every 6 (six) hours as needed for wheezing or shortness of breath., Disp: 1 Inhaler, Rfl: 0 .  buPROPion (WELLBUTRIN SR) 200 MG 12 hr tablet, Take 1 tablet (200 mg total) by mouth daily., Disp: 30 tablet, Rfl: 0 .  Cholecalciferol (VITAMIN D3) 1.25 MG (50000 UT) CAPS, Take 1 Dose by mouth once a week., Disp: 4 capsule, Rfl: 0 .  levonorgestrel (MIRENA) 20 MCG/24HR IUD, 1 each by Intrauterine route once., Disp: , Rfl:  .  metFORMIN (GLUCOPHAGE) 500 MG tablet, Take 1 tablet (500 mg total) by mouth 2 (two) times daily with a meal., Disp: 60 tablet, Rfl: 0   Allergies  Allergen Reactions  . Amitriptyline Rash  . Latex Rash  . Saxenda [Liraglutide -Weight Management] Itching    Injection site     Review of Systems  Constitutional: Negative.   HENT: Negative.   Eyes: Negative.   Respiratory: Negative.  Negative for shortness of breath.   Cardiovascular: Negative.  Negative for chest pain, palpitations and leg swelling.  Gastrointestinal: Negative.   Endocrine: Negative.   Genitourinary: Negative.   Musculoskeletal: Negative.   Skin: Negative.   Allergic/Immunologic: Negative.   Neurological: Negative.   Hematological: Negative.   Psychiatric/Behavioral: Negative.      Today's Vitals   05/04/19 1105  BP: 122/82  Pulse: 71  Temp: 98.2 F (36.8 C)  TempSrc: Oral  Weight: (!) 331 lb (150.1 kg)  PainSc: 0-No pain   Body mass index is 53.42 kg/m.   Objective:  Physical Exam Vitals reviewed.  Constitutional:      Appearance: Normal appearance. She is  well-developed.  HENT:     Head: Normocephalic and atraumatic.     Right Ear: Hearing, tympanic membrane, ear canal and external ear normal.     Left Ear: Hearing, tympanic membrane, ear canal and external ear normal.     Nose: Nose normal.     Mouth/Throat:     Mouth: Mucous membranes are moist.  Eyes:     General: Lids are normal.     Conjunctiva/sclera: Conjunctivae normal.     Pupils: Pupils are equal, round, and reactive to light.     Funduscopic exam:    Right eye: No papilledema.        Left eye: No papilledema.  Neck:     Thyroid: No thyroid mass.     Vascular: No carotid bruit.  Cardiovascular:     Rate and Rhythm: Normal rate and regular rhythm.     Pulses: Normal pulses.     Heart sounds: Normal heart sounds. No murmur.  Pulmonary:     Effort: Pulmonary effort is normal.     Breath sounds: Normal breath sounds.  Abdominal:     General: Abdomen is flat. Bowel sounds are normal.     Palpations: Abdomen is soft.  Musculoskeletal:        General: No swelling. Normal range of motion.     Cervical back: Full passive range of motion without pain, normal range of motion and neck supple.     Right lower leg: No edema.     Left lower leg: No edema.  Skin:    General: Skin is warm and dry.     Capillary Refill: Capillary refill takes less than 2 seconds.  Neurological:     General: No focal deficit present.     Mental Status: She is alert and oriented to person, place, and time.     Cranial Nerves: No cranial nerve deficit.     Sensory: No sensory deficit.  Psychiatric:        Mood and Affect: Mood normal.        Behavior: Behavior normal.        Thought Content: Thought content normal.        Judgment: Judgment normal.         Assessment And Plan:     1. Health maintenance examination . Behavior modifications discussed and diet history reviewed.   . Pt will continue to exercise regularly and modify diet with low GI, plant based foods and decrease intake of  processed foods.  . Recommend intake of daily multivitamin, Vitamin D, and calcium.  . Recommend mammogram for preventive screenings, as well as recommend immunizations that include influenza, TDAP - CBC  2. Prediabetes  Chronic, stable - POCT Urinalysis Dipstick (81002) - POCT UA - Microalbumin - Lipid panel - CMP14+EGFR - Hemoglobin  A1c - metFORMIN (GLUCOPHAGE) 500 MG tablet; Take 1 tablet (500 mg total) by mouth 2 (two) times daily with a meal.  Dispense: 180 tablet; Refill: 1  3. Class 3 severe obesity due to excess calories without serious comorbidity with body mass index (BMI) of 50.0 to 59.9 in adult [facility])  Chronic,   She has lost 6 lbs since her last office visit  Continue with regular exercise and healthy diet Wt Readings from Last 3 Encounters:  05/04/19 (!) 331 lb (150.1 kg)  03/25/18 (!) 337 lb (152.9 kg)  03/10/18 (!) 336 lb (152.4 kg)    - Hemoglobin A1c - buPROPion (WELLBUTRIN SR) 200 MG 12 hr tablet; Take 1 tablet (200 mg total) by mouth daily.  Dispense: 90 tablet; Refill: 1  4. Vitamin D deficiency  Will check vitamin D level and supplement as needed.     Also encouraged to spend 15 minutes in the sun daily.  - VITAMIN D 25 Hydroxy (Vit-D Deficiency, Fractures)  5. Other depression  Continue wellbutrin this may also help with weight loss    [name], FNP    THE PATIENT IS ENCOURAGED TO PRACTICE SOCIAL DISTANCING DUE TO THE COVID-19 PANDEMIC.

## 2019-05-05 ENCOUNTER — Other Ambulatory Visit: Payer: Self-pay | Admitting: Nurse Practitioner

## 2019-05-05 DIAGNOSIS — E559 Vitamin D deficiency, unspecified: Secondary | ICD-10-CM

## 2019-05-05 LAB — LIPID PANEL
Chol/HDL Ratio: 3.8 ratio (ref 0.0–4.4)
Cholesterol, Total: 179 mg/dL (ref 100–199)
HDL: 47 mg/dL (ref >39)
LDL Chol Calc (NIH): 119 mg/dL — ABNORMAL HIGH (ref 0–99)
Triglycerides: 71 mg/dL (ref 0–149)
VLDL Cholesterol Cal: 13 mg/dL (ref 5–40)

## 2019-05-05 LAB — CMP14+EGFR
ALT: 12 IU/L (ref 0–32)
AST: 20 IU/L (ref 0–40)
Albumin/Globulin Ratio: 1.9 (ref 1.2–2.2)
Albumin: 4.6 g/dL (ref 3.8–4.8)
Alkaline Phosphatase: 86 IU/L (ref 39–117)
BUN/Creatinine Ratio: 15 (ref 9–23)
BUN: 10 mg/dL (ref 6–24)
Bilirubin Total: 0.2 mg/dL (ref 0.0–1.2)
CO2: 24 mmol/L (ref 20–29)
Calcium: 9.9 mg/dL (ref 8.7–10.2)
Chloride: 105 mmol/L (ref 96–106)
Creatinine, Ser: 0.67 mg/dL (ref 0.57–1.00)
GFR calc Af Amer: 124 mL/min/{1.73_m2} (ref >59)
GFR calc non Af Amer: 107 mL/min/{1.73_m2} (ref >59)
Globulin, Total: 2.4 g/dL (ref 1.5–4.5)
Glucose: 82 mg/dL (ref 65–99)
Potassium: 4.5 mmol/L (ref 3.5–5.2)
Sodium: 142 mmol/L (ref 134–144)
Total Protein: 7 g/dL (ref 6.0–8.5)

## 2019-05-05 LAB — CBC
Hematocrit: 42.2 % (ref 34.0–46.6)
Hemoglobin: 13.8 g/dL (ref 11.1–15.9)
MCH: 29.5 pg (ref 26.6–33.0)
MCHC: 32.7 g/dL (ref 31.5–35.7)
MCV: 90 fL (ref 79–97)
Platelets: 396 10*3/uL (ref 150–450)
RBC: 4.68 x10E6/uL (ref 3.77–5.28)
RDW: 12.3 % (ref 11.7–15.4)
WBC: 4.6 10*3/uL (ref 3.4–10.8)

## 2019-05-05 LAB — VITAMIN D 25 HYDROXY (VIT D DEFICIENCY, FRACTURES): Vit D, 25-Hydroxy: 20.9 ng/mL — ABNORMAL LOW (ref 30.0–100.0)

## 2019-05-05 LAB — HEMOGLOBIN A1C
Est. average glucose Bld gHb Est-mCnc: 120 mg/dL
Hgb A1c MFr Bld: 5.8 % — ABNORMAL HIGH (ref 4.8–5.6)

## 2019-05-05 MED ORDER — VITAMIN D3 1.25 MG (50000 UT) PO CAPS: 1.0000 | ORAL_CAPSULE | ORAL | 2 refills | Status: DC

## 2019-05-05 MED FILL — VIT D3-50 50,000 UNITS CAPS: 1.25 MG | 28 days supply | Qty: 4 | Fill #0

## 2019-05-18 MED FILL — VIT D3-50 50,000 UNITS CAPS: 1.25 MG | 28 days supply | Qty: 4 | Fill #0

## 2019-05-28 DIAGNOSIS — Z1231 Encounter for screening mammogram for malignant neoplasm of breast: Secondary | ICD-10-CM | POA: Diagnosis not present

## 2019-05-28 DIAGNOSIS — Z6841 Body Mass Index (BMI) 40.0 and over, adult: Secondary | ICD-10-CM | POA: Diagnosis not present

## 2019-05-28 DIAGNOSIS — Z01419 Encounter for gynecological examination (general) (routine) without abnormal findings: Secondary | ICD-10-CM | POA: Diagnosis not present

## 2019-05-28 LAB — HM PAP SMEAR

## 2019-08-03 ENCOUNTER — Ambulatory Visit: Payer: 59 | Admitting: Nurse Practitioner

## 2019-08-14 MED FILL — BUPROPION HCL SR 200 MG TAB: 200 | 90 days supply | Qty: 90 | Fill #1

## 2019-08-14 MED FILL — METFORMIN HCL 500 MG TABS: 500 | 90 days supply | Qty: 180 | Fill #1

## 2019-09-22 MED FILL — BUPROPION HCL SR 200 MG TAB: 200 | 90 days supply | Qty: 90 | Fill #1

## 2019-09-22 MED FILL — METFORMIN HCL 500 MG TABS: 500 | 90 days supply | Qty: 180 | Fill #1

## 2019-09-22 MED FILL — VIT D3-50 50,000 UNITS CAPS: 1.25 MG | 28 days supply | Qty: 4 | Fill #1

## 2019-09-23 ENCOUNTER — Telehealth: Payer: 59 | Admitting: Nurse Practitioner

## 2019-09-23 DIAGNOSIS — H1031 Unspecified acute conjunctivitis, right eye: Secondary | ICD-10-CM

## 2019-09-23 MED ORDER — AMOXICILLIN-POT CLAVULANATE 875-125 MG PO TABS: 1.0000 | ORAL_TABLET | Freq: Two times a day (BID) | ORAL | 0 refills | Status: DC

## 2019-09-23 MED FILL — AMOX-CLAV 875-125 MG TABLET: 875-125 | 7 days supply | Qty: 14 | Fill #0

## 2019-09-23 NOTE — Progress Notes (Signed)

## 2019-12-30 ENCOUNTER — Other Ambulatory Visit: Payer: Self-pay | Admitting: Nurse Practitioner

## 2019-12-30 ENCOUNTER — Encounter: Payer: Self-pay | Admitting: Nurse Practitioner

## 2019-12-30 ENCOUNTER — Other Ambulatory Visit: Payer: Self-pay

## 2019-12-30 ENCOUNTER — Ambulatory Visit: Payer: 59 | Admitting: Nurse Practitioner

## 2019-12-30 VITALS — BP 136/92 | HR 93 | Temp 98.4°F | Ht 66.0 in | Wt 346.0 lb

## 2019-12-30 DIAGNOSIS — R7303 Prediabetes: Secondary | ICD-10-CM | POA: Diagnosis not present

## 2019-12-30 DIAGNOSIS — R030 Elevated blood-pressure reading, without diagnosis of hypertension: Secondary | ICD-10-CM

## 2019-12-30 DIAGNOSIS — Z6841 Body Mass Index (BMI) 40.0 and over, adult: Secondary | ICD-10-CM | POA: Diagnosis not present

## 2019-12-30 DIAGNOSIS — Z8742 Personal history of other diseases of the female genital tract: Secondary | ICD-10-CM | POA: Diagnosis not present

## 2019-12-30 DIAGNOSIS — E559 Vitamin D deficiency, unspecified: Secondary | ICD-10-CM

## 2019-12-30 DIAGNOSIS — R5383 Other fatigue: Secondary | ICD-10-CM | POA: Diagnosis not present

## 2019-12-30 DIAGNOSIS — Z72820 Sleep deprivation: Secondary | ICD-10-CM

## 2019-12-30 DIAGNOSIS — E8881 Metabolic syndrome: Secondary | ICD-10-CM | POA: Diagnosis not present

## 2019-12-30 DIAGNOSIS — Z1159 Encounter for screening for other viral diseases: Secondary | ICD-10-CM

## 2019-12-30 MED ORDER — BUPROPION HCL ER (SR) 200 MG PO TB12: 200.0000 mg | ORAL_TABLET | Freq: Every day | ORAL | 1 refills | Status: DC

## 2019-12-30 MED ORDER — RYBELSUS 7 MG PO TABS: 1.0000 | ORAL_TABLET | Freq: Every day | ORAL | 0 refills | Status: DC

## 2019-12-30 MED ORDER — METFORMIN HCL 500 MG PO TABS: 500.0000 mg | ORAL_TABLET | Freq: Two times a day (BID) | ORAL | 1 refills | Status: DC

## 2019-12-30 NOTE — Progress Notes (Signed)
I,Jessica Yoder as a Education administrator for [employer], FNP.,have documented all relevant documentation on the behalf of Jessica Brine, FNP,as directed by  Jessica Brine, FNP while in the presence of Jessica Yoder, Jessica Yoder. This visit occurred during the SARS-CoV-2 public health emergency.  Safety protocols were in place, including screening questions prior to the visit, additional usage of staff PPE, and extensive cleaning of exam room while observing appropriate contact time as indicated for disinfecting solutions.  Subjective:     Patient ID: Jessica Yoder , female    DOB: 1975-03-30 , 44 y.o.   MRN: [id number]   Chief Complaint  Patient presents with  . Prediabetes  . Fatigue  . Weight Loss    HPI  Patient here for a f/u on her prediabetes. She is also having more fatigue.  She is not exercising regularly.  She is working for [employer] and has been really busy.  She took [name] in the past and had allergic reaction to the needles.  She took her Covid Booster yesterday.    Wt Readings from Last 3 Encounters: 12/30/19 : (!) 346 lb (156.9 kg) 05/04/19 : (!) 331 lb (150.1 kg) 03/25/18 : (!) 337 lb (152.9 kg)  She does not feel rested after sleeping at night and she will awaken often throughout the night.  She is sitting more related to work.  She is working from home at times. She does snack more.   She goes to [facility].     Past Medical History:  Diagnosis Date  . Allergy   . Elevated hemoglobin A1c   . Migraine   . Vitamin D deficiency      Family History  Problem Relation Age of Onset  . Other Father        ETOH Abuse  . Thyroid disease Mother   . Obesity Mother   . Cancer Other   . Hyperlipidemia Other   . Stroke Other   . Diabetes Other   . Obesity Other   . Sleep apnea Other      Current Outpatient Medications:  .  buPROPion (WELLBUTRIN SR) 200 MG 12 hr tablet, Take 1 tablet (200 mg total) by mouth daily., Disp: 90 tablet, Rfl: 1 .  Cholecalciferol  (VITAMIN D3) 1.25 MG (50000 UT) CAPS, Take 1 Dose by mouth once a week., Disp: 4 capsule, Rfl: 2 .  levonorgestrel (MIRENA) 20 MCG/24HR IUD, 1 each by Intrauterine route once., Disp: , Rfl:  .  metFORMIN (GLUCOPHAGE) 500 MG tablet, Take 1 tablet (500 mg total) by mouth 2 (two) times daily with a meal., Disp: 180 tablet, Rfl: 1 .  Semaglutide (RYBELSUS) 7 MG TABS, Take 1 tablet by mouth daily., Disp: 90 tablet, Rfl: 0   Allergies  Allergen Reactions  . Amitriptyline Rash  . Latex Rash  . Saxenda [Liraglutide -Weight Management] Itching    Injection site     Review of Systems  Constitutional: Negative.   HENT: Negative.   Eyes: Negative.   Respiratory: Negative.   Cardiovascular: Negative.   Gastrointestinal: Negative.   Endocrine: Negative.   Genitourinary: Negative.   Musculoskeletal: Negative.   Skin: Negative.   Neurological: Negative.   Hematological: Negative.   Psychiatric/Behavioral: Negative.      Today's Vitals   12/30/19 1134 12/30/19 1209  BP: (!) 138/96 (!) 136/92  Pulse: 93   Temp: 98.4 F (36.9 C)   TempSrc: Oral   Weight: (!) 346 lb (156.9 kg)   Height: _0  (1.676  m)   PainSc: 0-No pain    Body mass index is 55.85 kg/m.   Objective:  Physical Exam Vitals reviewed.  Constitutional:      General: She is not in acute distress.    Appearance: Normal appearance. She is well-developed. She is obese.  Cardiovascular:     Rate and Rhythm: Normal rate and regular rhythm.     Pulses: Normal pulses.     Heart sounds: Normal heart sounds. No murmur heard.   Pulmonary:     Effort: Pulmonary effort is normal. No respiratory distress.     Breath sounds: Normal breath sounds.  Chest:     Chest wall: No tenderness.  Musculoskeletal:     Cervical back: Normal range of motion and neck supple.  Skin:    General: Skin is dry.  Neurological:     General: No focal deficit present.     Mental Status: She is alert and oriented to person, place, and time.   Psychiatric:        Mood and Affect: Mood normal.        Behavior: Behavior normal.        Thought Content: Thought content normal.        Judgment: Judgment normal.         Assessment And Plan:     1. Prediabetes  Chronic, will try her on rybelsus, she did not do well with saxenda as she had a reaction to the needles - Insulin, random - Semaglutide (RYBELSUS) 7 MG TABS; Take 1 tablet by mouth daily.  Dispense: 90 tablet; Refill: 0 - metFORMIN (GLUCOPHAGE) 500 MG tablet; Take 1 tablet (500 mg total) by mouth 2 (two) times daily with a meal.  Dispense: 180 tablet; Refill: 1  2. Elevated blood-pressure reading without diagnosis of hypertension She is encouraged to avoid high salt foods Her elevated blood pressure may be related to her poor sleep  3. Vitamin D deficiency  Will check vitamin D level and supplement as needed.     Also encouraged to spend 15 minutes in the sun daily.   4. Insulin resistance - Insulin, random  5. Fatigue, unspecified type  Will check for metabolic causes  This may also be related to poor sleep will send for sleep study - CBC - BMP8+eGFR - TSH - Ambulatory referral to Sleep Studies  6. Poor sleep - Ambulatory referral to Sleep Studies  7. Encounter for hepatitis C screening test for low risk patient  Will check Hepatitis C screening due to recent recommendations to screen all adults 18 years and older - Hepatitis C antibody  8. History of PCOS  9. Class 3 severe obesity due to excess calories without serious comorbidity with body mass index (BMI) of 50.0 to 59.9 in adult [facility])  Chronic  Discussed healthy diet and regular exercise options   Encouraged to exercise at least 150 minutes per week with 2 days of strength training.  Goal to get BMI less than 50 to start, she may need a referral to a weight loss center  - Insulin, random - Ambulatory referral to Sleep Studies - buPROPion (WELLBUTRIN SR) 200 MG 12 hr tablet; Take 1  tablet (200 mg total) by mouth daily.  Dispense: 90 tablet; Refill: 1     Patient was given opportunity to ask questions. Patient verbalized understanding of the plan and was able to repeat key elements of the plan. All questions were answered to their satisfaction.    I, Jessica Brine, FNP,  have reviewed all documentation for this visit. The documentation on 01/06/20 for the exam, diagnosis, procedures, and orders are all accurate and complete.  THE PATIENT IS ENCOURAGED TO PRACTICE SOCIAL DISTANCING DUE TO THE COVID-19 PANDEMIC.

## 2019-12-31 LAB — CBC
Hematocrit: 42.2 % (ref 34.0–46.6)
Hemoglobin: 13.9 g/dL (ref 11.1–15.9)
MCH: 28.8 pg (ref 26.6–33.0)
MCHC: 32.9 g/dL (ref 31.5–35.7)
MCV: 87 fL (ref 79–97)
Platelets: 401 10*3/uL (ref 150–450)
RBC: 4.83 x10E6/uL (ref 3.77–5.28)
RDW: 12.2 % (ref 11.7–15.4)
WBC: 5.7 10*3/uL (ref 3.4–10.8)

## 2019-12-31 LAB — INSULIN, RANDOM: INSULIN: 20.3 u[IU]/mL (ref 2.6–24.9)

## 2019-12-31 LAB — BMP8+EGFR
BUN/Creatinine Ratio: 11 (ref 9–23)
BUN: 8 mg/dL (ref 6–24)
CO2: 25 mmol/L (ref 20–29)
Calcium: 9.7 mg/dL (ref 8.7–10.2)
Chloride: 99 mmol/L (ref 96–106)
Creatinine, Ser: 0.75 mg/dL (ref 0.57–1.00)
GFR calc Af Amer: 112 mL/min/{1.73_m2} (ref >59)
GFR calc non Af Amer: 97 mL/min/{1.73_m2} (ref >59)
Glucose: 81 mg/dL (ref 65–99)
Potassium: 4.3 mmol/L (ref 3.5–5.2)
Sodium: 139 mmol/L (ref 134–144)

## 2019-12-31 LAB — HEPATITIS C ANTIBODY: Hep C Virus Ab: <0.1 s/co ratio (ref 0.0–0.9)

## 2019-12-31 LAB — TSH: TSH: 1.34 u[IU]/mL (ref 0.450–4.500)

## 2019-12-31 MED FILL — BUPROPION HCL SR 200 MG TAB: 200 | 90 days supply | Qty: 90 | Fill #0

## 2019-12-31 MED FILL — METFORMIN HCL 500 MG TABS: 500 | 90 days supply | Qty: 180 | Fill #0

## 2020-01-04 ENCOUNTER — Encounter: Payer: Self-pay | Admitting: Nurse Practitioner

## 2020-01-06 ENCOUNTER — Encounter: Payer: Self-pay | Admitting: Nurse Practitioner

## 2020-01-08 LAB — VITAMIN D 25 HYDROXY (VIT D DEFICIENCY, FRACTURES): Vit D, 25-Hydroxy: 21.4 ng/mL — ABNORMAL LOW (ref 30.0–100.0)

## 2020-01-08 LAB — SPECIMEN STATUS REPORT

## 2020-01-21 DIAGNOSIS — Z03818 Encounter for observation for suspected exposure to other biological agents ruled out: Secondary | ICD-10-CM | POA: Diagnosis not present

## 2020-02-02 ENCOUNTER — Other Ambulatory Visit: Payer: Self-pay

## 2020-02-02 ENCOUNTER — Ambulatory Visit: Payer: 59 | Admitting: Neurology

## 2020-02-02 ENCOUNTER — Encounter: Payer: Self-pay | Admitting: Neurology

## 2020-02-02 VITALS — BP 115/76 | HR 88 | Ht 67.0 in | Wt 343.0 lb

## 2020-02-02 DIAGNOSIS — R5383 Other fatigue: Secondary | ICD-10-CM

## 2020-02-02 DIAGNOSIS — R0683 Snoring: Secondary | ICD-10-CM

## 2020-02-02 DIAGNOSIS — Z9189 Other specified personal risk factors, not elsewhere classified: Secondary | ICD-10-CM | POA: Diagnosis not present

## 2020-02-02 DIAGNOSIS — G479 Sleep disorder, unspecified: Secondary | ICD-10-CM | POA: Diagnosis not present

## 2020-02-02 DIAGNOSIS — R519 Headache, unspecified: Secondary | ICD-10-CM

## 2020-02-02 DIAGNOSIS — Z6841 Body Mass Index (BMI) 40.0 and over, adult: Secondary | ICD-10-CM | POA: Diagnosis not present

## 2020-02-02 DIAGNOSIS — G478 Other sleep disorders: Secondary | ICD-10-CM

## 2020-02-02 NOTE — Progress Notes (Signed)
Subjective:    Patient ID: Jessica Yoder is a 45 y.o. female.  HPI     Huston Foley, MD, PhD [facility] [address] [address] [place], [place] [address]  Dear [name],   I saw your patient, Jessica Yoder, upon your kind request, in my Sleep clinic today for initial consultation of her sleep disorder, in particular, concern for underlying obstructive sleep apnea.  The patient is unaccompanied today.  As you know, Jessica Yoder is a 45 year old right-handed woman with an underlying medical history of vitamin D deficiency, allergies, prediabetes, migraine headaches and morbid obesity with a BMI of over 50, status post gastric banding, who reports snoring and excessive daytime somnolence.  I reviewed your office note from 12/30/2019.  Her Epworth sleepiness score is 7/24, fatigue severity score is 22 out of 63.  She is a Engineer, civil (consulting), she has been working for [employer], has worked for [employer] for 24 years.  She is married and lives with her husband and 45-year-old daughter, they have 1 dog in the household, the dog does not sleep in the bedroom with them.  Sometimes a 73-year-old comes to their bedroom.  She has a variable sleep schedule.  She also teaches online part-time.  She has a history of bruxism and had a bite guard but does not use it currently.  Bedtime is generally around 1130 and rise time between 530 and 6:15 AM.  She does have fairly frequent morning headaches.  She has a history of migraines and her morning headaches are more dull and achy and not migrainous.  Sometimes she does take Excedrin Migraine for the morning headaches.  Generally speaking, she tries to write it out.  She does drink caffeine on a daily basis, about 2 to 3 cups of coffee per day, occasional soda and occasional tea, she is cutting back on her soda and tea intake.  She is a non-smoker and drinks alcohol occasionally, probably less than once a month.  She has recently had some medication changes.  She could  not tolerate Saxenda.  She has started Rybelsus.  She has no known family history of sleep apnea.  She has had trouble losing weight.  She has been to the weight loss clinic but has not gone for about a year.  She does not have night to night nocturia.  She is a restless sleeper, tosses and turns a lot.  She does not wake up rested.  She often likes to sleep on her stomach.  They do have a TV on at night in the bedroom, she tends to have it stay on a low volume because she does not like to sleep in the dark.  Her Past Medical History Is Significant For: Past Medical History:  Diagnosis Date  . Allergy   . Elevated hemoglobin A1c   . Migraine   . Vitamin D deficiency     Her Past Surgical History Is Significant For: Past Surgical History:  Procedure Laterality Date  . EYE SURGERY  2011   Lasik  . LAPAROSCOPIC GASTRIC BANDING  06/28/2008    Her Family History Is Significant For: Family History  Problem Relation Age of Onset  . Other Father        ETOH Abuse  . Thyroid disease Mother   . Obesity Mother   . Cancer Other   . Hyperlipidemia Other   . Stroke Other   . Diabetes Other   . Obesity Other   . Sleep apnea  Other     Her Social History Is Significant For: Social History   Socioeconomic History  . Marital status: Married    Spouse name: Edyth GunnelsMaurice Hulsebus  . Number of children: 1  . Years of education: Not on file  . Highest education level: Not on file  Occupational History  . Occupation: [employer]  Tobacco Use  . Smoking status: Never Smoker  . Smokeless tobacco: Never Used  Vaping Use  . Vaping Use: Never used  Substance and Sexual Activity  . Alcohol use: Yes    Comment: socially  . Drug use: No  . Sexual activity: Yes    Birth control/protection: None  Other Topics Concern  . Not on file  Social History Narrative  . Not on file   Social Determinants of Health   Financial Resource Strain: Not on file  Food Insecurity: Not on file  Transportation Needs: Not on  file  Physical Activity: Not on file  Stress: Not on file  Social Connections: Not on file    Her Allergies Are:  Allergies  Allergen Reactions  . Amitriptyline Rash  . Latex Rash  . Saxenda [Liraglutide -Weight Management] Itching    Injection site  :   Her Current Medications Are:  Outpatient Encounter Medications as of 02/02/2020  Medication Sig  . buPROPion (WELLBUTRIN SR) 200 MG 12 hr tablet Take 1 tablet (200 mg total) by mouth daily.  . Cholecalciferol (VITAMIN D3) 1.25 MG (50000 UT) CAPS Take 1 Dose by mouth once a week.  Marland Kitchen. levonorgestrel (MIRENA) 20 MCG/24HR IUD 1 each by Intrauterine route once.  . metFORMIN (GLUCOPHAGE) 500 MG tablet Take 1 tablet (500 mg total) by mouth 2 (two) times daily with a meal.  . Semaglutide (RYBELSUS) 7 MG TABS Take 1 tablet by mouth daily.   No facility-administered encounter medications on file as of 02/02/2020.  :  Review of Systems:  Out of a complete 14 point review of systems, all are reviewed and negative with the exception of these symptoms as listed below:  Review of Systems  Neurological:       Pt presents today to discuss her sleep. Pt has never had a sleep study but does endorse snoring.  Epworth Sleepiness Scale 0= would never doze 1= slight chance of dozing 2= moderate chance of dozing 3= high chance of dozing  Sitting and reading: 1 Watching TV: 1 Sitting inactive in a public place (ex. Theater or meeting): 1 As a passenger in a car for an hour without a break: 3 Lying down to rest in the afternoon: 1 Sitting and talking to someone: 0 Sitting quietly after lunch (no alcohol): 0 In a car, while stopped in traffic: 0 Total: 7     Objective:  Neurological Exam  Physical Exam Physical Examination:   Vitals:   02/02/20 1338  BP: 115/76  Pulse: 88    General Examination: The patient is a very pleasant 45 y.o. female in no acute distress. She appears well-developed and well-nourished and well groomed.    HEENT: Normocephalic, atraumatic, pupils are equal, round and reactive to light, extraocular tracking is good without limitation to gaze excursion or nystagmus noted. Hearing is grossly intact. Face is symmetric with normal facial animation. Speech is clear with no dysarthria noted. There is no hypophonia. There is no lip, neck/head, jaw or voice tremor. Neck is supple with full range of passive and active motion. There are no carotid bruits on auscultation. Oropharynx exam reveals: mild mouth dryness, good  dental hygiene and mild airway crowding, due to Small airway entry, slightly elongated tongue, thicker soft palate noted but tonsils small and uvula normal, Mallampati class III.  Neck circumference of 15 and three-quarter inches.  Tongue protrudes centrally and palate elevates symmetrically.  Chest: Clear to auscultation without wheezing, rhonchi or crackles noted.  Heart: S1+S2+0, regular and normal without murmurs, rubs or gallops noted.   Abdomen: Soft, non-tender and non-distended with normal bowel sounds appreciated on auscultation.  Extremities: There is no pitting edema in the distal lower extremities bilaterally.   Skin: Warm and dry without trophic changes noted.   Musculoskeletal: exam reveals no obvious joint deformities, tenderness or joint swelling or erythema.   Neurologically:  Mental status: The patient is awake, alert and oriented in all 4 spheres. Her immediate and remote memory, attention, language skills and fund of knowledge are appropriate. There is no evidence of aphasia, agnosia, apraxia or anomia. Speech is clear with normal prosody and enunciation. Thought process is linear. Mood is normal and affect is normal.  Cranial nerves II - XII are as described above under HEENT exam.  Motor exam: Normal bulk, strength and tone is noted. There is no tremor, Romberg is negative. Fine motor skills and coordination: grossly intact.  Cerebellar testing: No dysmetria or  intention tremor. There is no truncal or gait ataxia.  Sensory exam: intact to light touch in the upper and lower extremities.  Gait, station and balance: She stands easily. No veering to one side is noted. No leaning to one side is noted. Posture is age-appropriate and stance is narrow based. Gait shows normal stride length and normal pace. No problems turning are noted. Tandem walk is unremarkable.                Assessment and Plan:  In summary, VALERY AMEDEE is a very pleasant 45 y.o.-year old female with an underlying medical history of vitamin D deficiency, allergies, prediabetes, migraine headaches and morbid obesity with a BMI of over 50, status post gastric banding, whose history and physical exam are concerning for obstructive sleep apnea (OSA). I had a long chat with the patient about my findings and the diagnosis of OSA, its prognosis and treatment options. We talked about medical treatments, surgical interventions and non-pharmacological approaches. I explained in particular the risks and ramifications of untreated moderate to severe OSA, especially with respect to developing cardiovascular disease down the Road, including congestive heart failure, difficult to treat hypertension, cardiac arrhythmias, or stroke. Even type 2 diabetes has, in part, been linked to untreated OSA. Symptoms of untreated OSA include daytime sleepiness, memory problems, mood irritability and mood disorder such as depression and anxiety, lack of energy, as well as recurrent headaches, especially morning headaches. We talked about trying to maintain a healthy lifestyle in general, as well as the importance of weight control. We also talked about the importance of good sleep hygiene. I recommended the following at this time: sleep study.   I explained the sleep test procedure to the patient and also outlined possible surgical and non-surgical treatment options of OSA, including the use of a custom-made dental device  (which would require a referral to a specialist dentist or oral surgeon), upper airway surgical options, such as traditional UPPP or a novel less invasive surgical option in the form of Inspire hypoglossal nerve stimulation (which would involve a referral to an ENT surgeon). I also explained the CPAP treatment option to the patient, who indicated that she would be willing  to try CPAP if the need arises. I explained the importance of being compliant with PAP treatment, not only for insurance purposes but primarily to improve Her symptoms, and for the patient's long term health benefit, including to reduce Her cardiovascular risks. I answered all her questions today and the patient was in agreement. I plan to see her back after the sleep study is completed and encouraged her to call with any interim questions, concerns, problems or updates.   Thank you very much for allowing me to participate in the care of this nice patient. If I can be of any further assistance to you please do not hesitate to call me at [phone].  Sincerely,   Huston Foley, MD, PhD

## 2020-02-02 NOTE — Patient Instructions (Signed)

## 2020-02-09 ENCOUNTER — Telehealth: Payer: Self-pay

## 2020-02-09 NOTE — Telephone Encounter (Signed)
Pt consent to MYCHART visit 

## 2020-02-10 ENCOUNTER — Other Ambulatory Visit (HOSPITAL_COMMUNITY): Payer: Self-pay | Admitting: Nurse Practitioner

## 2020-02-10 ENCOUNTER — Other Ambulatory Visit: Payer: Self-pay

## 2020-02-10 ENCOUNTER — Telehealth: Payer: Self-pay

## 2020-02-10 ENCOUNTER — Encounter: Payer: Self-pay | Admitting: Nurse Practitioner

## 2020-02-10 ENCOUNTER — Telehealth (INDEPENDENT_AMBULATORY_CARE_PROVIDER_SITE_OTHER): Payer: 59 | Admitting: Nurse Practitioner

## 2020-02-10 DIAGNOSIS — R7303 Prediabetes: Secondary | ICD-10-CM | POA: Diagnosis not present

## 2020-02-10 DIAGNOSIS — E559 Vitamin D deficiency, unspecified: Secondary | ICD-10-CM

## 2020-02-10 MED ORDER — VITAMIN D3 1.25 MG (50000 UT) PO CAPS
1.0000 | ORAL_CAPSULE | ORAL | 1 refills | Status: AC
Start: 2020-02-10 — End: ?

## 2020-02-10 MED FILL — VIT D3-50 50,000 UNITS CAPS: 1.25 MG | 84 days supply | Qty: 12 | Fill #0

## 2020-02-10 NOTE — Patient Instructions (Signed)
Diabetes Care, 44(Suppl 1), S34-S39. https://doi.org/https://doi.org/10.2337/dc21-S003">  Prediabetes Prediabetes is when your blood sugar (blood glucose) level is higher than normal but not high enough for you to be diagnosed with type 2 diabetes. Having prediabetes puts you at risk for developing type 2 diabetes (type 2 diabetes mellitus). With certain lifestyle changes, you may be able to prevent or delay the onset of type 2 diabetes. This is important because type 2 diabetes can lead to serious complications, such as:  Heart disease.  Stroke.  Blindness.  Kidney disease.  Depression.  Poor circulation in the feet and legs. In severe cases, this could lead to surgical removal of a leg (amputation). What are the causes? The exact cause of prediabetes is not known. It may result from insulin resistance. Insulin resistance develops when cells in the body do not respond properly to insulin that the body makes. This can cause excess glucose to build up in the blood. High blood glucose (hyperglycemia) can develop. What increases the risk? The following factors may make you more likely to develop this condition:  You have a family member with type 2 diabetes.  You are older than 45 years.  You had a temporary form of diabetes during a pregnancy (gestational diabetes).  You had polycystic ovary syndrome (PCOS).  You are overweight or obese.  You are inactive (sedentary).  You have a history of heart disease, including problems with cholesterol levels, high levels of blood fats, or high blood pressure. What are the signs or symptoms? You may have no symptoms. If you do have symptoms, they may include:  Increased hunger.  Increased thirst.  Increased urination.  Vision changes, such as blurry vision.  Tiredness (fatigue). How is this diagnosed? This condition can be diagnosed with blood tests. Your blood glucose may be checked with one or more of the following tests:  A  fasting blood glucose (FBG) test. You will not be allowed to eat (you will fast) for at least 8 hours before a blood sample is taken.  An A1C blood test (hemoglobin A1C). This test provides information about blood glucose levels over the previous 2?3 months.  An oral glucose tolerance test (OGTT). This test measures your blood glucose at two points in time: ? After fasting. This is your baseline level. ? Two hours after you drink a beverage that contains glucose. You may be diagnosed with prediabetes if:  Your FBG is 100?125 mg/dL (5.6-6.9 mmol/L).  Your A1C level is 5.7?6.4% (39-46 mmol/mol).  Your OGTT result is 140?199 mg/dL (7.8-11 mmol/L). These blood tests may be repeated to confirm your diagnosis.   How is this treated? Treatment may include dietary and lifestyle changes to help lower your blood glucose and prevent type 2 diabetes from developing. In some cases, medicine may be prescribed to help lower the risk of type 2 diabetes. Follow these instructions at home: Nutrition  Follow a healthy meal plan. This includes eating lean proteins, whole grains, legumes, fresh fruits and vegetables, low-fat dairy products, and healthy fats.  Follow instructions from your health care provider about eating or drinking restrictions.  Meet with a dietitian to create a healthy eating plan that is right for you.   Lifestyle  Do moderate-intensity exercise for at least 30 minutes a day on 5 or more days each week, or as told by your health care provider. A mix of activities may be best, such as: ? Brisk walking, swimming, biking, and weight lifting.  Lose weight as told by your health   care provider. Losing 5-7% of your body weight can reverse insulin resistance.  Do not drink alcohol if: ? Your health care provider tells you not to drink. ? You are pregnant, may be pregnant, or are planning to become pregnant.  If you drink alcohol: ? Limit how much you use to:  0-1 drink a day for  women.  0-2 drinks a day for men. ? Be aware of how much alcohol is in your drink. In the [place] one drink equals one 12 oz bottle of beer (355 mL), one 5 oz glass of wine (148 mL), or one 1 oz glass of hard liquor (44 mL). General instructions  Take over-the-counter and prescription medicines only as told by your health care provider. You may be prescribed medicines that help lower the risk of type 2 diabetes.  Do not use any products that contain nicotine or tobacco, such as cigarettes, e-cigarettes, and chewing tobacco. If you need help quitting, ask your health care provider.  Keep all follow-up visits. This is important. Where to find more information  American Diabetes Association: www.diabetes.org  Academy of Nutrition and Dietetics: www.eatright.org  American Heart Association: www.heart.org Contact a health care provider if:  You have any of these symptoms: ? Increased hunger. ? Increased urination. ? Increased thirst. ? Fatigue. ? Vision changes, such as blurry vision. Get help right away if you:  Have shortness of breath.  Feel confused.  Vomit or feel like you may vomit. Summary  Prediabetes is when your blood sugar (blood glucose)level is higher than normal but not high enough for you to be diagnosed with type 2 diabetes.  Having prediabetes puts you at risk for developing type 2 diabetes (type 2 diabetes mellitus).  Make lifestyle changes such as eating a healthy diet and exercising regularly to help prevent diabetes. Lose weight as told by your health care provider. This information is not intended to replace advice given to you by your health care provider. Make sure you discuss any questions you have with your health care provider. Document Revised: 04/09/2019 Document Reviewed: 04/09/2019 Elsevier Patient Education  2021 Elsevier Inc.  

## 2020-02-10 NOTE — Telephone Encounter (Signed)
Unable to LVM for pt to call me back to schedule sleep study. MB is full.

## 2020-02-10 NOTE — Progress Notes (Signed)
Virtual Visit via [place] - failed virtual   This visit type was conducted due to national recommendations for restrictions regarding the COVID-19 Pandemic (e.g. social distancing) in an effort to limit this patient's exposure and mitigate transmission in our community.  Due to her co-morbid illnesses, this patient is at least at moderate risk for complications without adequate follow up.  This format is felt to be most appropriate for this patient at this time.  All issues noted in this document were discussed and addressed.  A limited physical exam was performed with this format.    This visit type was conducted due to national recommendations for restrictions regarding the COVID-19 Pandemic (e.g. social distancing) in an effort to limit this patient's exposure and mitigate transmission in our community.  Patients identity confirmed using two different identifiers.  This format is felt to be most appropriate for this patient at this time.  All issues noted in this document were discussed and addressed.  No physical exam was performed (except for noted visual exam findings with Video Visits).    Date:  02/10/2020   ID:  Jessica Yoder, DOB 1975/09/07, MRN [id number]  Patient Location:  Car - spoke with [name]  Provider location:   Office    Chief Complaint:  rybelsus follow up   History of Present Illness:    Jessica Yoder is a 45 y.o. female who presents via video conferencing for a telehealth visit today.    The patient does not have symptoms concerning for COVID-19 infection (fever, chills, cough, or new shortness of breath).   Follow up rybelsus - no nausea or constipation. She takes a colace once a day on a regular. She met with the Neurology for sleep study for Feb 3rd. She can tell her appetite is decreased. She has tried to take two metformin a day.      Past Medical History:  Diagnosis Date  . Allergy   . Elevated hemoglobin A1c   . Migraine   . Vitamin D  deficiency    Past Surgical History:  Procedure Laterality Date  . EYE SURGERY  2011   Lasik  . LAPAROSCOPIC GASTRIC BANDING  06/28/2008     Current Meds  Medication Sig  . buPROPion (WELLBUTRIN SR) 200 MG 12 hr tablet Take 1 tablet (200 mg total) by mouth daily.  Marland Kitchen levonorgestrel (MIRENA) 20 MCG/24HR IUD 1 each by Intrauterine route once.  . metFORMIN (GLUCOPHAGE) 500 MG tablet Take 1 tablet (500 mg total) by mouth 2 (two) times daily with a meal.  . Semaglutide (RYBELSUS) 7 MG TABS Take 1 tablet by mouth daily.  . [DISCONTINUED] Cholecalciferol (VITAMIN D3) 1.25 MG (50000 UT) CAPS Take 1 Dose by mouth once a week.     Allergies:   Amitriptyline, Latex, and Saxenda [liraglutide -weight management]   Social History   Tobacco Use  . Smoking status: Never Smoker  . Smokeless tobacco: Never Used  Vaping Use  . Vaping Use: Never used  Substance Use Topics  . Alcohol use: Yes    Comment: socially  . Drug use: No     Family Hx: The patient's family history includes Cancer in an other family member; Diabetes in an other family member; Hyperlipidemia in an other family member; Obesity in her mother and another family member; Other in her father; Sleep apnea in an other family member; Stroke in an other family member; Thyroid disease in her mother.  ROS:   Please see the history  of present illness.    Review of Systems  Constitutional: Negative.   Respiratory: Negative.   Cardiovascular: Negative.  Negative for chest pain.  Neurological: Negative for dizziness and headaches.  Psychiatric/Behavioral: Negative.     All other systems reviewed and are negative.   Labs/Other Tests and Data Reviewed:    Recent Labs: 05/04/2019: ALT 12 12/30/2019: BUN 8; Creatinine, Ser 0.75; Hemoglobin 13.9; Platelets 401; Potassium 4.3; Sodium 139; TSH 1.340   Recent Lipid Panel Lab Results  Component Value Date/Time   CHOL 179 05/04/2019 12:20 PM   TRIG 71 05/04/2019 12:20 PM   HDL 47  05/04/2019 12:20 PM   CHOLHDL 3.8 05/04/2019 12:20 PM   LDLCALC 119 (H) 05/04/2019 12:20 PM    Wt Readings from Last 3 Encounters:  02/02/20 (!) 343 lb (155.6 kg)  12/30/19 (!) 346 lb (156.9 kg)  05/04/19 (!) 331 lb (150.1 kg)     Exam:    Vital Signs:  There were no vitals taken for this visit.    Physical Exam Constitutional:      General: She is not in acute distress. Pulmonary:     Effort: No respiratory distress.  Neurological:     Mental Status: She is alert and oriented to person, place, and time.  Psychiatric:        Mood and Affect: Mood normal.        Behavior: Behavior normal.        Thought Content: Thought content normal.        Judgment: Judgment normal.     ASSESSMENT & PLAN:    1. Prediabetes She is tolerating rybelsus well She will be picking up the 7 mg dose this week Will recheck Hgb1c at next visit and she is taking metformin at least once a day  2. Vitamin D deficiency  Continues to remain low will send rx for 50,000 units daily    Also encouraged to spend 15 minutes in the sun daily.  - Cholecalciferol (VITAMIN D3) 1.25 MG (50000 UT) CAPS; Take 1 Dose by mouth once a week.  Dispense: 12 capsule; Refill: 1     COVID-19 Education: The signs and symptoms of COVID-19 were discussed with the patient and how to seek care for testing (follow up with PCP or arrange E-visit).  The importance of social distancing was discussed today.  Patient Risk:   After full review of this patients clinical status, I feel that they are at least moderate risk at this time.  Time:   Today, I have spent 10 minutes/ seconds with the patient with telehealth technology discussing above diagnoses.     Medication Adjustments/Labs and Tests Ordered: Current medicines are reviewed at length with the patient today.  Concerns regarding medicines are outlined above.   Tests Ordered: No orders of the defined types were placed in this encounter.   Medication  Changes: Meds ordered this encounter  Medications  . Cholecalciferol (VITAMIN D3) 1.25 MG (50000 UT) CAPS    Sig: Take 1 Dose by mouth once a week.    Dispense:  12 capsule    Refill:  1    Disposition:  Follow up prn, has appt in April will cancel March appt  Signed, [name], FNP

## 2020-02-16 MED FILL — RYBELSUS 7 MG TABS: 7 | 90 days supply | Qty: 90 | Fill #0

## 2020-02-25 ENCOUNTER — Other Ambulatory Visit: Payer: Self-pay

## 2020-02-25 ENCOUNTER — Ambulatory Visit (INDEPENDENT_AMBULATORY_CARE_PROVIDER_SITE_OTHER): Payer: 59 | Admitting: Neurology

## 2020-02-25 DIAGNOSIS — Z6841 Body Mass Index (BMI) 40.0 and over, adult: Secondary | ICD-10-CM

## 2020-02-25 DIAGNOSIS — G479 Sleep disorder, unspecified: Secondary | ICD-10-CM

## 2020-02-25 DIAGNOSIS — R0683 Snoring: Secondary | ICD-10-CM | POA: Diagnosis not present

## 2020-02-25 DIAGNOSIS — Z9189 Other specified personal risk factors, not elsewhere classified: Secondary | ICD-10-CM

## 2020-02-25 DIAGNOSIS — R5383 Other fatigue: Secondary | ICD-10-CM

## 2020-02-25 DIAGNOSIS — G4734 Idiopathic sleep related nonobstructive alveolar hypoventilation: Secondary | ICD-10-CM

## 2020-02-25 DIAGNOSIS — R0902 Hypoxemia: Secondary | ICD-10-CM

## 2020-02-25 DIAGNOSIS — G478 Other sleep disorders: Secondary | ICD-10-CM

## 2020-02-25 DIAGNOSIS — R519 Headache, unspecified: Secondary | ICD-10-CM

## 2020-03-02 ENCOUNTER — Encounter: Payer: Self-pay | Admitting: Neurology

## 2020-03-02 NOTE — Progress Notes (Signed)
Patient referred by Arnette Felts, NP, seen by me on 02/02/20, diagnostic PSG on 02/25/20.   Please call and notify the patient that the recent sleep study did not show any significant obstructive sleep apnea, in that her total AHI was less than 5/hour.  CPAP is typically not recommended for patients with an AHI of less than 5/h and typically also not covered by the insurance.  She did have sleep apnea during dream sleep/REM sleep.  She also had several desaturations into the 80s, as low as 76 during REM sleep.  Some of the desaturations were not in the context of actually having an obstructive respiratory event. She may have a condition called obesity-hypoventilation. An underlying lung related cause is not excluded based on this test.  Please advise patient to try to continue to work aggressively on weight loss and try to avoid sleeping on her back.  I would recommend that she follow-up with her primary care nurse practitioner and discuss the possibility of seeing a pulmonologist next.  From the sleep apnea standpoint, she does not need to be on a CPAP or AutoPap machine but weight loss is recommended and avoiding the supine sleep position.  At this juncture, she can follow-up with her primary care and follow-up in sleep clinic as needed.  Thanks,  Huston Foley, MD, PhD [facility])

## 2020-03-02 NOTE — Procedures (Signed)
PATIENT'S NAME:  Jessica Yoder, Jessica Yoder DOB:      November 17, 1975      MR#:    [id number]     DATE OF RECORDING: 02/25/2020 REFERRING M.D.:  Huston Foley, MD Study Performed:   Baseline Polysomnogram HISTORY: 45 year old right-handed woman with an underlying medical history of vitamin D deficiency, allergies, prediabetes, migraine headaches and morbid obesity with a BMI of over 50, status post gastric banding, who reports snoring and excessive daytime somnolence. The patient endorsed the Epworth Sleepiness Scale at 7 points. The patient's weight 343 pounds with a height of 67 (inches), resulting in a BMI of 54. kg/m2.   CURRENT MEDICATIONS: Wellbutrin, Vitamin D, Glucophage, Rybelsus,   PROCEDURE:  This is a multichannel digital polysomnogram utilizing the Somnostar 11.2 system.  Electrodes and sensors were applied and monitored per AASM Specifications.   EEG, EOG, Chin and Limb EMG, were sampled at 200 Hz.  ECG, Snore and Nasal Pressure, Thermal Airflow, Respiratory Effort, CPAP Flow and Pressure, Oximetry was sampled at 50 Hz. Digital video and audio were recorded.      BASELINE STUDY  Lights Out was at 21:58 and Lights On at 04:59.  Total recording time (TRT) was 422 minutes, with a total sleep time (TST) of 398 minutes.   The patient's sleep latency was 7.5 minutes.  REM latency was 160 minutes, which is delayed. The sleep efficiency was 94.3 %.     SLEEP ARCHITECTURE: WASO (Wake after sleep onset) was 19.5 minutes with minimal sleep fragmentation noted.  There were 20 minutes in Stage N1, 231.5 minutes Stage N2, 87.5 minutes Stage N3 and 59 minutes in Stage REM.  The percentage of Stage N1 was 5.%, Stage N2 was 58.2%, which is mildly increased, Stage N3 was 22.% and Stage R (REM sleep) was 14.8%, which is mildly reduced. The arousals were noted as: 18 were spontaneous, 0 were associated with PLMs, 0 were associated with respiratory events.  RESPIRATORY ANALYSIS:  There were a total of 13 respiratory events:  0  obstructive apneas, 1 central apneas and 0 mixed apneas with a total of 1 apneas and an apnea index (AI) of .2 /hour. There were 12 hypopneas with a hypopnea index of 1.8 /hour. The patient also had 0 respiratory event related arousals (RERAs).      The total APNEA/HYPOPNEA INDEX (AHI) was 2./hour and the total RESPIRATORY DISTURBANCE INDEX was  2. /hour.  8 events occurred in REM sleep and 9 events in NREM. The REM AHI was  8.1 /hour, versus a non-REM AHI of .9. The patient spent 293 minutes of total sleep time in the supine position and 105 minutes in non-supine.. The supine AHI was 1.4 versus a non-supine AHI of 3.4.  OXYGEN SATURATION & C02:  The Wake baseline 02 saturation was 90%, with the lowest being 76%. Time spent below 89% saturation equaled 40 minutes.  PERIODIC LIMB MOVEMENTS: The patient had a total of 0 Periodic Limb Movements.  The Periodic Limb Movement (PLM) index was 0 and the PLM Arousal index was 0/hour. Audio and video analysis did not show any abnormal or unusual movements, behaviors, phonations or vocalizations. The patient took 1 bathroom break. Snoring was noted, ranging from mild to loud. The EKG was in keeping with normal sinus rhythm (NSR).  Post-study, the patient indicated that sleep was better than usual.   IMPRESSION:  1. Primary Snoring 2. Oxygen desaturations during REM sleep 3. Nocturnal hypoxemia  RECOMMENDATIONS:  1. This study does not demonstrate any significant  obstructive sleep apnea, with the exception of snoring and REM sleep related OSA. She did have desaturations primarily in REM sleep, especially during supine REM sleep, even in the absence of obstructive respiratory events. Obesity-hypoventilation-syndrome may be a possible cause. While treatment with CPAP or autoPAP is not warranted, the patient will be advised to work on work loss and avoid sleeping on her back. An underlying pulmonary cause for oxygen drops during sleep cannot be fully excluded.  The patient will be advised to follow up with her primary care provider and discuss possible referral to pulmonology for consultation. Other causes, including circadian rhythm disturbances, an underlying mood disorder, medication effect and/or an underlying medical problem cannot be ruled out. 2. The patient should be cautioned not to drive, work at heights, or operate dangerous or heavy equipment when tired or sleepy. Review and reiteration of good sleep hygiene measures should be pursued with any patient. 3. The patient will be advised to follow up with the referring provider, who will be notified of the test results.  I certify that I have reviewed the entire raw data recording prior to the issuance of this report in accordance with the Standards of Accreditation of the American Academy of Sleep Medicine (AASM)  Huston Foley, MD, PhD Diplomat, American Board of Neurology and Sleep Medicine (Neurology and Sleep Medicine)

## 2020-03-03 ENCOUNTER — Telehealth: Payer: Self-pay

## 2020-03-03 NOTE — Telephone Encounter (Signed)
-----   Message from Huston Foley, MD sent at 03/02/2020  5:08 PM EST ----- Patient referred by Jessica Felts, NP, seen by me on 02/02/20, diagnostic PSG on 02/25/20.   Please call and notify the patient that the recent sleep study did not show any significant obstructive sleep apnea, in that her total AHI was less than 5/hour.  CPAP is typically not recommended for patients with an AHI of less than 5/h and typically also not covered by the insurance.  She did have sleep apnea during dream sleep/REM sleep.  She also had several desaturations into the 80s, as low as 76 during REM sleep.  Some of the desaturations were not in the context of actually having an obstructive respiratory event. She may have a condition called obesity-hypoventilation. An underlying lung related cause is not excluded based on this test.  Please advise patient to try to continue to work aggressively on weight loss and try to avoid sleeping on her back.  I would recommend that she follow-up with her primary care nurse practitioner and discuss the possibility of seeing a pulmonologist next.  From the sleep apnea standpoint, she does not need to be on a CPAP or AutoPap machine but weight loss is recommended and avoiding the supine sleep position.  At this juncture, she can follow-up with her primary care and follow-up in sleep clinic as needed.  Thanks,  Huston Foley, MD, PhD [facility])

## 2020-03-03 NOTE — Telephone Encounter (Signed)
I sent patient a my chart message with results of sleep study. Patient was advised to message me back if she had any questions. Report will be sent to Arnette Felts, FNP.

## 2020-03-24 DIAGNOSIS — H524 Presbyopia: Secondary | ICD-10-CM | POA: Diagnosis not present

## 2020-03-24 DIAGNOSIS — H5213 Myopia, bilateral: Secondary | ICD-10-CM | POA: Diagnosis not present

## 2020-03-29 ENCOUNTER — Ambulatory Visit: Payer: 59 | Admitting: Nurse Practitioner

## 2020-05-09 ENCOUNTER — Encounter: Payer: 59 | Admitting: Nurse Practitioner

## 2020-07-19 MED FILL — Bupropion HCl Tab ER 12HR 200 MG: ORAL | 90 days supply | Qty: 90 | Fill #0 | Status: CN

## 2020-07-19 MED FILL — Metformin HCl Tab 500 MG: ORAL | 90 days supply | Qty: 180 | Fill #0 | Status: CN

## 2020-07-20 ENCOUNTER — Other Ambulatory Visit (HOSPITAL_COMMUNITY): Payer: Self-pay

## 2020-07-22 ENCOUNTER — Other Ambulatory Visit (HOSPITAL_COMMUNITY): Payer: Self-pay

## 2020-07-22 DIAGNOSIS — Z03818 Encounter for observation for suspected exposure to other biological agents ruled out: Secondary | ICD-10-CM | POA: Diagnosis not present

## 2020-07-28 ENCOUNTER — Other Ambulatory Visit (HOSPITAL_COMMUNITY): Payer: Self-pay

## 2020-07-29 ENCOUNTER — Other Ambulatory Visit (HOSPITAL_COMMUNITY): Payer: Self-pay

## 2020-07-29 MED ORDER — CARESTART COVID-19 HOME TEST VI KIT
PACK | 0 refills | Status: AC
  Filled 2020-07-29: qty 4, 4d supply, fill #0

## 2020-07-29 MED FILL — Metformin HCl Tab 500 MG: ORAL | 90 days supply | Qty: 180 | Fill #0 | Status: AC

## 2020-07-29 MED FILL — Bupropion HCl Tab ER 12HR 200 MG: ORAL | 90 days supply | Qty: 90 | Fill #0 | Status: AC

## 2020-08-02 DIAGNOSIS — Z03818 Encounter for observation for suspected exposure to other biological agents ruled out: Secondary | ICD-10-CM | POA: Diagnosis not present

## 2020-08-02 DIAGNOSIS — Z20822 Contact with and (suspected) exposure to covid-19: Secondary | ICD-10-CM | POA: Diagnosis not present

## 2020-08-22 DIAGNOSIS — Z03818 Encounter for observation for suspected exposure to other biological agents ruled out: Secondary | ICD-10-CM | POA: Diagnosis not present

## 2020-08-22 DIAGNOSIS — Z20822 Contact with and (suspected) exposure to covid-19: Secondary | ICD-10-CM | POA: Diagnosis not present

## 2020-09-07 DIAGNOSIS — Z6841 Body Mass Index (BMI) 40.0 and over, adult: Secondary | ICD-10-CM | POA: Diagnosis not present

## 2020-09-07 DIAGNOSIS — Z01419 Encounter for gynecological examination (general) (routine) without abnormal findings: Secondary | ICD-10-CM | POA: Diagnosis not present

## 2020-09-07 DIAGNOSIS — Z1231 Encounter for screening mammogram for malignant neoplasm of breast: Secondary | ICD-10-CM | POA: Diagnosis not present

## 2020-11-23 ENCOUNTER — Telehealth: Payer: 59 | Admitting: Physician Assistant

## 2020-11-23 DIAGNOSIS — J40 Bronchitis, not specified as acute or chronic: Secondary | ICD-10-CM

## 2020-11-23 MED ORDER — BENZONATATE 100 MG PO CAPS: 100.0000 mg | ORAL_CAPSULE | Freq: Three times a day (TID) | ORAL | 0 refills | Status: DC | PRN

## 2020-11-23 MED ORDER — PREDNISONE 10 MG (21) PO TBPK: ORAL_TABLET | ORAL | 0 refills | Status: DC

## 2020-11-23 NOTE — Progress Notes (Signed)
We are sorry that you are not feeling well.  Here is how we plan to help!  Based on your presentation I believe you most likely have A cough due to a virus.  This is called viral bronchitis and is best treated by rest, plenty of fluids and control of the cough.  You may use Ibuprofen or Tylenol as directed to help your symptoms.     In addition you may use A prescription cough medication called Tessalon Perles 100mg. You may take 1-2 capsules every 8 hours as needed for your cough.  Prednisone 10 mg daily for 6 days (see taper instructions below)  Directions for 6 day taper: Day 1: 2 tablets before breakfast, 1 after both lunch & dinner and 2 at bedtime Day 2: 1 tab before breakfast, 1 after both lunch & dinner and 2 at bedtime Day 3: 1 tab at each meal & 1 at bedtime Day 4: 1 tab at breakfast, 1 at lunch, 1 at bedtime Day 5: 1 tab at breakfast & 1 tab at bedtime Day 6: 1 tab at breakfast  From your responses in the eVisit questionnaire you describe inflammation in the upper respiratory tract which is causing a significant cough.  This is commonly called Bronchitis and has four common causes:   Allergies Viral Infections Acid Reflux Bacterial Infection Allergies, viruses and acid reflux are treated by controlling symptoms or eliminating the cause. An example might be a cough caused by taking certain blood pressure medications. You stop the cough by changing the medication. Another example might be a cough caused by acid reflux. Controlling the reflux helps control the cough.  USE OF BRONCHODILATOR ("RESCUE") INHALERS: There is a risk from using your bronchodilator too frequently.  The risk is that over-reliance on a medication which only relaxes the muscles surrounding the breathing tubes can reduce the effectiveness of medications prescribed to reduce swelling and congestion of the tubes themselves.  Although you feel brief relief from the bronchodilator inhaler, your asthma may actually be  worsening with the tubes becoming more swollen and filled with mucus.  This can delay other crucial treatments, such as oral steroid medications. If you need to use a bronchodilator inhaler daily, several times per day, you should discuss this with your provider.  There are probably better treatments that could be used to keep your asthma under control.     HOME CARE Only take medications as instructed by your medical team. Complete the entire course of an antibiotic. Drink plenty of fluids and get plenty of rest. Avoid close contacts especially the very young and the elderly Cover your mouth if you cough or cough into your sleeve. Always remember to wash your hands A steam or ultrasonic humidifier can help congestion.   GET HELP RIGHT AWAY IF: You develop worsening fever. You become short of breath You cough up blood. Your symptoms persist after you have completed your treatment plan MAKE SURE YOU  Understand these instructions. Will watch your condition. Will get help right away if you are not doing well or get worse.    Thank you for choosing an e-visit.  Your e-visit answers were reviewed by a board certified advanced clinical practitioner to complete your personal care plan. Depending upon the condition, your plan could have included both over the counter or prescription medications.  Please review your pharmacy choice. Make sure the pharmacy is open so you can pick up prescription now. If there is a problem, you may contact your provider   through MyChart messaging and have the prescription routed to another pharmacy.  Your safety is important to us. If you have drug allergies check your prescription carefully.   For the next 24 hours you can use MyChart to ask questions about today's visit, request a non-urgent call back, or ask for a work or school excuse. You will get an email in the next two days asking about your experience. I hope that your e-visit has been valuable and will  speed your recovery.  I provided 6 minutes of non face-to-face time during this encounter for chart review and documentation.   

## 2020-12-07 ENCOUNTER — Telehealth: Payer: Self-pay | Admitting: Nurse Practitioner

## 2020-12-07 DIAGNOSIS — J029 Acute pharyngitis, unspecified: Secondary | ICD-10-CM

## 2020-12-07 NOTE — Progress Notes (Signed)
  E-Visit for Sore Throat  We are sorry that you are not feeling well.  Here is how we plan to help!  Your symptoms indicate a likely viral infection (Pharyngitis).   Pharyngitis is inflammation in the back of the throat which can cause a sore throat, scratchiness and sometimes difficulty swallowing.   Pharyngitis is typically caused by a respiratory virus and will just run its course.  Please keep in mind that your symptoms could last up to 10 days.  For throat pain, we recommend over the counter oral pain relief medications such as acetaminophen or aspirin, or anti-inflammatory medications such as ibuprofen or naproxen sodium.  Topical treatments such as oral throat lozenges or sprays may be used as needed.  Avoid close contact with loved ones, especially the very young and elderly.  Remember to wash your hands thoroughly throughout the day as this is the number one way to prevent the spread of infection and wipe down door knobs and counters with disinfectant.  After careful review of your answers, I would not recommend and antibiotic for your condition.  Antibiotics should not be used to treat conditions that we suspect are caused by viruses like the virus that causes the common cold or flu. However, some people can have Strep with atypical symptoms. You may need formal testing in clinic or office to confirm if your symptoms continue or worsen.  Providers prescribe antibiotics to treat infections caused by bacteria. Antibiotics are very powerful in treating bacterial infections when they are used properly.  To maintain their effectiveness, they should be used only when necessary.  Overuse of antibiotics has resulted in the development of super bugs that are resistant to treatment!    Home Care: Only take medications as instructed by your medical team. Do not drink alcohol while taking these medications. A steam or ultrasonic humidifier can help congestion.  You can place a towel over your head and  breathe in the steam from hot water coming from a faucet. Avoid close contacts especially the very young and the elderly. Cover your mouth when you cough or sneeze. Always remember to wash your hands.  Get Help Right Away If: You develop worsening fever or throat pain. You develop a severe head ache or visual changes. Your symptoms persist after you have completed your treatment plan.  Make sure you Understand these instructions. Will watch your condition. Will get help right away if you are not doing well or get worse.   Thank you for choosing an e-visit.  Your e-visit answers were reviewed by a board certified advanced clinical practitioner to complete your personal care plan. Depending upon the condition, your plan could have included both over the counter or prescription medications.  Please review your pharmacy choice. Make sure the pharmacy is open so you can pick up prescription now. If there is a problem, you may contact your provider through MyChart messaging and have the prescription routed to another pharmacy.  Your safety is important to us. If you have drug allergies check your prescription carefully.   For the next 24 hours you can use MyChart to ask questions about today's visit, request a non-urgent call back, or ask for a work or school excuse. You will get an email in the next two days asking about your experience. I hope that your e-visit has been valuable and will speed your recovery.  5-10 minutes spent reviewing and documenting in chart.  

## 2021-01-22 ENCOUNTER — Ambulatory Visit (HOSPITAL_COMMUNITY)
Admission: EM | Admit: 2021-01-22 | Discharge: 2021-01-22 | Disposition: A | Discharge status: Home / Self Care | Payer: Managed Care, Other (non HMO) | Attending: Family Medicine | Admitting: Family Medicine

## 2021-01-22 ENCOUNTER — Other Ambulatory Visit: Payer: Self-pay

## 2021-01-22 ENCOUNTER — Telehealth (HOSPITAL_COMMUNITY): Payer: Self-pay

## 2021-01-22 ENCOUNTER — Encounter (HOSPITAL_COMMUNITY): Payer: Self-pay

## 2021-01-22 DIAGNOSIS — Z1152 Encounter for screening for COVID-19: Secondary | ICD-10-CM | POA: Diagnosis not present

## 2021-01-22 DIAGNOSIS — J069 Acute upper respiratory infection, unspecified: Secondary | ICD-10-CM

## 2021-01-22 DIAGNOSIS — Z112 Encounter for screening for other bacterial diseases: Secondary | ICD-10-CM | POA: Insufficient documentation

## 2021-01-22 DIAGNOSIS — H109 Unspecified conjunctivitis: Secondary | ICD-10-CM | POA: Diagnosis not present

## 2021-01-22 LAB — POC INFLUENZA A AND B ANTIGEN (URGENT CARE ONLY)
INFLUENZA A ANTIGEN, POC: NEGATIVE
INFLUENZA B ANTIGEN, POC: NEGATIVE

## 2021-01-22 LAB — POCT RAPID STREP A, ED / UC: Streptococcus, Group A Screen (Direct): NEGATIVE

## 2021-01-22 MED ORDER — POLYMYXIN B-TRIMETHOPRIM 10000-0.1 UNIT/ML-% OP SOLN: 1.0000 [drp] | OPHTHALMIC | 0 refills | Status: AC

## 2021-01-22 MED ORDER — BENZONATATE 100 MG PO CAPS
100.0000 mg | ORAL_CAPSULE | Freq: Three times a day (TID) | ORAL | 0 refills | Status: DC
Start: 2021-01-22 — End: 2021-01-22

## 2021-01-22 MED ORDER — POLYMYXIN B-TRIMETHOPRIM 10000-0.1 UNIT/ML-% OP SOLN: 1.0000 [drp] | OPHTHALMIC | 0 refills | Status: DC

## 2021-01-22 MED ORDER — ALBUTEROL SULFATE HFA 108 (90 BASE) MCG/ACT IN AERS: 1.0000 | INHALATION_SPRAY | Freq: Four times a day (QID) | RESPIRATORY_TRACT | 0 refills | Status: AC | PRN

## 2021-01-22 MED ORDER — ALBUTEROL SULFATE HFA 108 (90 BASE) MCG/ACT IN AERS: 1.0000 | INHALATION_SPRAY | Freq: Four times a day (QID) | RESPIRATORY_TRACT | 0 refills | Status: DC | PRN

## 2021-01-22 MED ORDER — PREDNISONE 10 MG (21) PO TBPK: ORAL_TABLET | Freq: Every day | ORAL | 0 refills | Status: AC

## 2021-01-22 MED ORDER — BENZONATATE 100 MG PO CAPS: 100.0000 mg | ORAL_CAPSULE | Freq: Three times a day (TID) | ORAL | 0 refills | Status: AC

## 2021-01-22 MED ORDER — PREDNISONE 10 MG (21) PO TBPK: ORAL_TABLET | Freq: Every day | ORAL | 0 refills | Status: DC

## 2021-01-22 NOTE — ED Provider Notes (Addendum)
[facility]    CSN: [id number] Arrival date & time: 01/22/21  1000      History   Chief Complaint Chief Complaint  Patient presents with   Conjunctivitis    Pt presents to the office for cough and sore throat X 2-3 days.    HPI Jessica Yoder is a 46 y.o. female presenting with viral syndrome x3 days. History Lasix 2011. IUD contraception.  Sore throat 12/30, cough 12/31, red R eye 1/1 Subjective chills. Temperature running 99 at home.  Sore throat persists, with tonsillar enlargement. Denies sensation of throat closing, trouble swallowing, trouble handling secretions. Has been doing tea, Mucinex.  Cough is productive of yellow sputum.  Negative home covid  Denies history pulm ds. Denies SOB, CP, dizziness, weakness. Denies myalgias.  Not vaccinated for influenza.   R eye with redness and crusting in the morning. Wears glasses for reading only, never contacts.  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 Authorizing Provider   benzonatate (TESSALON) 100 MG capsule Take 1 capsule (100 mg total) by mouth every 8 (eight) hours. 01/22/21  Yes Hazel Sams, PA-C  predniSONE (STERAPRED UNI-PAK 21 TAB) 10 MG (21) TBPK tablet Take by mouth daily. Take 6 tabs by mouth daily  for 2 days, then 5 tabs for 2 days, then 4 tabs for 2 days, then 3 tabs for 2 days, 2 tabs for 2 days, then 1 tab by mouth daily for 2 days 01/22/21  Yes Phillip Heal, Sherlon Handing, PA-C  trimethoprim-polymyxin b (POLYTRIM) ophthalmic solution Place 1 drop into the right eye every 4 (four) hours for 7 days. 01/22/21 01/29/21 Yes Hazel Sams, PA-C  buPROPion (WELLBUTRIN SR) 200 MG 12 hr tablet TAKE 1 TABLET (200 MG TOTAL) BY MOUTH DAILY. 12/30/19 12/29/20  [name], FNP  Cholecalciferol (VITAMIN D3) 1.25 MG (50000 UT) CAPS Take 1 Dose by mouth once a week. 02/10/20   [name], FNP  Cholecalciferol 1.25 MG (50000 UT) capsule TAKE 1 CAPSULE BY MOUTH ONCE A WEEK. 02/10/20 02/09/21  [name], FNP  COVID-19 At Home Antigen Test [facility] COVID-19 HOME TEST) KIT Use as directed 07/29/20   Jefm Bryant, [facility]  levonorgestrel (MIRENA) 20 MCG/24HR  IUD 1 each by Intrauterine route once.    [provider]  metFORMIN (GLUCOPHAGE) 500 MG tablet TAKE 1 TABLET (500 MG TOTAL) BY MOUTH 2 (TWO) TIMES DAILY WITH A MEAL. 12/30/19 12/29/20  [name], FNP    Family History Family History  Problem Relation Age of Onset   Other Father        ETOH Abuse   Thyroid disease Mother    Obesity Mother    Cancer Other    Hyperlipidemia Other    Stroke Other    Diabetes Other    Obesity Other    Sleep apnea Other     Social History Social History   Tobacco Use   Smoking status: Never   Smokeless tobacco: Never  Vaping Use   Vaping Use: Never used  Substance Use Topics   Alcohol use: Yes    Comment: socially   Drug use: No     Allergies   Amitriptyline, Latex, and Saxenda [liraglutide -weight management]   Review of Systems Review of Systems  HENT:   Positive for congestion and sore throat.   Eyes:  Positive for discharge and redness. Negative for photophobia, pain, itching and visual disturbance.  Respiratory:  Positive for cough.   All other systems reviewed and are negative.   Physical Exam Triage Vital Signs ED Triage Vitals  Enc Vitals Group     BP 01/22/21 1011 133/74     Pulse Rate 01/22/21 1011 (!) 109     Resp 01/22/21 1011 18     Temp 01/22/21 1011 99.5 F (37.5 C)     Temp Source 01/22/21 1011 Oral     SpO2 01/22/21 1011 100 %     Weight --      Height --      Head Circumference --      Peak Flow --      Pain Score 01/22/21 1012 0     Pain Loc --      Pain Edu? --      Excl. in [place]? --    No data found.  Updated Vital Signs BP 133/74 (BP Location: Left Arm)    Pulse (!) 109    Temp 99.5 F (37.5 C) (Oral)    Resp 18    SpO2 100%   Visual Acuity Right Eye Distance:   Left Eye Distance:   Bilateral Distance:    Right Eye Near:   Left Eye Near:    Bilateral Near:     Physical Exam Vitals reviewed.  Constitutional:      General: She is not in acute distress.    Appearance: Normal appearance. She is not ill-appearing.  HENT:     Head: Normocephalic and atraumatic.     Right Ear: Tympanic membrane, ear canal and external ear normal. No tenderness. No middle ear effusion. There is no impacted cerumen. Tympanic membrane is not perforated, erythematous, retracted or bulging.     Left Ear: Tympanic membrane, ear canal and external ear normal. No tenderness.  No middle ear effusion. There is no impacted cerumen. Tympanic membrane is not perforated, erythematous, retracted or bulging.     Nose: Nose normal. No congestion.     Mouth/Throat:     Mouth: Mucous membranes are moist.     Pharynx: Uvula midline. Posterior oropharyngeal erythema present. No oropharyngeal exudate.     Tonsils: Tonsillar exudate present. 2+ on the right. 2+ on the left.     Comments: Smooth erythema posterior pharynx, tonsils  2+  bilaterally with exudate. On exam, uvula is midline, she is tolerating her secretions without difficulty, there is no trismus, no drooling, she has normal phonation  Eyes:     General: Lids are normal. Lids are everted, no foreign bodies appreciated. Vision grossly intact. Gaze aligned appropriately. No visual field deficit or scleral icterus.       Right eye: No foreign body, discharge or hordeolum.        Left eye: No foreign body, discharge or hordeolum.     Extraocular Movements: Extraocular movements intact.     Right eye: Normal extraocular motion and no nystagmus.     Left eye: Normal extraocular motion and no nystagmus.     Conjunctiva/sclera:     Right eye: Right conjunctiva is injected. No chemosis, exudate or hemorrhage.    Left eye: Left conjunctiva is not injected. No chemosis, exudate or hemorrhage.    Pupils: Pupils are equal, round, and reactive to light.     Comments: R conjunctival injection without discharge. PERRLA, EOMI. Visual acuity intact.  Cardiovascular:     Rate and Rhythm: Regular rhythm. Tachycardia present.     Heart sounds: Normal heart sounds.  Pulmonary:     Effort: Pulmonary effort is normal.     Breath sounds: Normal breath sounds. No decreased breath sounds, wheezing, rhonchi or rales.  Abdominal:     Palpations: Abdomen is soft.     Tenderness: There is no abdominal tenderness. There is no guarding or rebound.  Lymphadenopathy:     Cervical: Cervical adenopathy present.     Right cervical: Superficial cervical adenopathy present. No deep or posterior cervical adenopathy.    Left cervical: Superficial cervical adenopathy present. No deep or posterior cervical adenopathy.  Neurological:     General: No focal deficit present.     Mental Status: She is alert and oriented to person, place, and time.  Psychiatric:        Mood and Affect: Mood normal.        Behavior: Behavior normal.        Thought Content: Thought content normal.        Judgment:  Judgment normal.     UC Treatments / Results  Labs (all labs ordered are listed, but only abnormal results are displayed) Labs Reviewed  SARS CORONAVIRUS 2 (TAT 6-24 HRS)  CULTURE, GROUP A STREP ([place])  POC INFLUENZA A AND B ANTIGEN (URGENT CARE ONLY)  POCT RAPID STREP A, ED / UC    EKG   Radiology No results found.  Procedures Procedures (including critical care time)  Medications Ordered in UC Medications - No data to display  Initial Impression / Assessment and Plan / UC Course  I have reviewed the triage vital signs and the nursing notes.  Pertinent labs & imaging results that were available during my care of the patient were reviewed by me and considered in my medical decision making (see chart for details).     This patient is a very pleasant 46 y.o. year old female presenting with viral URI and conjunctivitis. Borderline febrile, tachy. IUD contraception. Visual acuity intact. History Lasix 2011, does not wear contacts. No history pulm ds. Symptoms x3 days.  Bacterial conjunctivitis. Polytrim as below.  Negative home covid test. Covid PCR sent at patient request.  She is not vaccinated for influenza. Rapid influenza negative. Today is day 3 of symptoms; she is outside of the tamiflu window. Rapid strep negative, culture sent.  Polytrim, prednisone, tessalon, albuterol as  below.   ED return precautions discussed. Patient verbalizes understanding and agreement.   Coding Level 4 for acute illness with systemic symptoms, and prescription drug management   Final Clinical Impressions(s) / UC Diagnoses   Final diagnoses:  Viral URI with cough  Bacterial conjunctivitis of right eye  Screening for streptococcal infection  Encounter for screening for COVID-19     Discharge Instructions      -Your strep and flu tests were negative. We'll call you tomorrow if the covid was positive.  -Use the Polytrim drops for your conjunctivitis.  Put 1 drop in the affected  eye every 4 hours while awake for 7 days.   -You can also use warm compresses 1-2 times daily. -If your symptoms get worse instead of better, follow-up immediately with ophthalmologist or other eye doctor.  This includes vision changes, eyelid swelling, pain with movement, worsening of redness despite treatment. -Prednisone, 2 pills taken at the same time for 5 days in a row.  Try taking this earlier in the day as it can give you energy. Avoid NSAIDs like ibuprofen and alleve while taking this medication as they can increase your risk of stomach upset and even GI bleeding when in combination with a steroid. You can continue tylenol (acetaminophen) up to 1093m 3x daily. -Tessalon (Benzonatate) as needed for cough. Take one pill up to 3x daily (every 8 hours) -You can continue over-the-counter medications too, if they're helping.     ED Prescriptions     Medication Sig Dispense Auth. Provider   trimethoprim-polymyxin b (POLYTRIM) ophthalmic solution Place 1 drop into the right eye every 4 (four) hours for 7 days. 10 mL GHazel Sams PA-C   predniSONE (STERAPRED UNI-PAK 21 TAB) 10 MG (21) TBPK tablet Take by mouth daily. Take 6 tabs by mouth daily  for 2 days, then 5 tabs for 2 days, then 4 tabs for 2 days, then 3 tabs for 2 days, 2 tabs for 2 days, then 1 tab by mouth daily for 2 days 42 tablet GHazel Sams PA-C   benzonatate (TESSALON) 100 MG capsule Take 1 capsule (100 mg total) by mouth every 8 (eight) hours. 21 capsule GHazel Sams PA-C      PDMP not reviewed this encounter.   GHazel Sams PA-C 01/22/21 1050    GHazel Sams PA-C 01/22/21 1055

## 2021-01-22 NOTE — Telephone Encounter (Signed)
Received call from patient, she would like medication called into [facility] on [address] since the other pharmacy are closed. Medication has been sent to update pharmacy.

## 2021-01-22 NOTE — ED Triage Notes (Signed)
Pt presents to the office with red eye, sore throat and coughing for a couple days.

## 2021-01-22 NOTE — Discharge Instructions (Addendum)
-  Your strep and flu tests were negative. We'll call you tomorrow if the covid was positive.  -Use the Polytrim drops for your conjunctivitis.  Put 1 drop in the affected eye every 4 hours while awake for 7 days.   -You can also use warm compresses 1-2 times daily. -If your symptoms get worse instead of better, follow-up immediately with ophthalmologist or other eye doctor.  This includes vision changes, eyelid swelling, pain with movement, worsening of redness despite treatment. -Prednisone, 2 pills taken at the same time for 5 days in a row.  Try taking this earlier in the day as it can give you energy. Avoid NSAIDs like ibuprofen and alleve while taking this medication as they can increase your risk of stomach upset and even GI bleeding when in combination with a steroid. You can continue tylenol (acetaminophen) up to 1000mg  3x daily. -Tessalon (Benzonatate) as needed for cough. Take one pill up to 3x daily (every 8 hours) -Albuterol inhaler as needed for cough, wheezing, shortness of breath, 1 to 2 puffs every 6 hours as needed. -You can continue over-the-counter medications too, if they're helping.

## 2021-01-23 LAB — SARS CORONAVIRUS 2 (TAT 6-24 HRS): SARS Coronavirus 2: NEGATIVE

## 2021-01-24 LAB — CULTURE, GROUP A STREP (THRC)

## 2021-08-30 ENCOUNTER — Encounter (INDEPENDENT_AMBULATORY_CARE_PROVIDER_SITE_OTHER): Payer: Self-pay
